# Patient Record
Sex: Male | Born: 1955 | Race: White | Hispanic: No | Marital: Married | Smoking: Former smoker
Health system: Southern US, Community
[De-identification: ages and names within clinical notes are randomized; demographics above are authoritative.]

## PROBLEM LIST (undated history)

## (undated) DIAGNOSIS — K219 Gastro-esophageal reflux disease without esophagitis: Secondary | ICD-10-CM

## (undated) DIAGNOSIS — E785 Hyperlipidemia, unspecified: Secondary | ICD-10-CM

## (undated) DIAGNOSIS — E559 Vitamin D deficiency, unspecified: Secondary | ICD-10-CM

## (undated) DIAGNOSIS — M6530 Trigger finger, unspecified finger: Secondary | ICD-10-CM

## (undated) DIAGNOSIS — N400 Enlarged prostate without lower urinary tract symptoms: Secondary | ICD-10-CM

## (undated) HISTORY — PX: EYE SURGERY: SHX253

## (undated) HISTORY — DX: Benign prostatic hyperplasia without lower urinary tract symptoms: N40.0

## (undated) HISTORY — PX: APPENDECTOMY: SHX54

## (undated) HISTORY — DX: Hyperlipidemia, unspecified: E78.5

## (undated) HISTORY — PX: OTHER SURGICAL HISTORY: SHX169

## (undated) HISTORY — DX: Vitamin D deficiency, unspecified: E55.9

---

## 1999-04-06 ENCOUNTER — Inpatient Hospital Stay (HOSPITAL_COMMUNITY): Admission: EM | Admit: 1999-04-06 | Discharge: 1999-04-08 | Payer: Self-pay | Admitting: Surgery

## 1999-04-06 ENCOUNTER — Encounter: Payer: Self-pay | Admitting: Surgery

## 1999-04-06 ENCOUNTER — Encounter (INDEPENDENT_AMBULATORY_CARE_PROVIDER_SITE_OTHER): Payer: Self-pay | Admitting: Specialist

## 2004-06-26 ENCOUNTER — Ambulatory Visit (HOSPITAL_COMMUNITY): Admission: RE | Admit: 2004-06-26 | Discharge: 2004-06-26 | Payer: Self-pay | Admitting: Family Medicine

## 2004-06-30 ENCOUNTER — Ambulatory Visit (HOSPITAL_COMMUNITY): Admission: RE | Admit: 2004-06-30 | Discharge: 2004-06-30 | Payer: Self-pay | Admitting: Family Medicine

## 2004-08-09 HISTORY — PX: CARPAL TUNNEL RELEASE: SHX101

## 2005-05-14 ENCOUNTER — Ambulatory Visit (HOSPITAL_BASED_OUTPATIENT_CLINIC_OR_DEPARTMENT_OTHER): Admission: RE | Admit: 2005-05-14 | Discharge: 2005-05-14 | Payer: Self-pay | Admitting: Orthopedic Surgery

## 2005-05-14 ENCOUNTER — Ambulatory Visit (HOSPITAL_COMMUNITY): Admission: RE | Admit: 2005-05-14 | Discharge: 2005-05-14 | Payer: Self-pay | Admitting: Orthopedic Surgery

## 2010-08-30 ENCOUNTER — Encounter: Payer: Self-pay | Admitting: Family Medicine

## 2010-12-25 NOTE — Op Note (Signed)
Michael Clark, Michael Clark               ACCOUNT NO.:  [id number]   MEDICAL RECORD NO.:  [id number]          PATIENT TYPE:  AMB   LOCATION:  [facility]                         FACILITY:  [facility]   PHYSICIAN:  Marlowe Kays, M.D.  DATE OF BIRTH:  [birth year]   DATE OF PROCEDURE:  05/14/2005  DATE OF DISCHARGE:                                 OPERATIVE REPORT   PREOPERATIVE DIAGNOSES:  1.  Symptomatic left trigger thumb.  2.  Left carpal tunnel syndrome.   POSTOPERATIVE DIAGNOSES:  1.  Symptomatic left trigger thumb.  2.  Left carpal tunnel syndrome.   OPERATION:  1.  Release A1 pulley, left thumb.  2.  Decompression median nerve left wrist and hand.   SURGEON:  Marlowe Kays, M.D.   ASSISTANT:  Nurse.   ANESTHESIA:  General.   PATHOLOGY AND JUSTIFICATION FOR PROCEDURE:  He has had a painful symptomatic  left trigger thumb which has failed to respond to cortisone injections as  well as severe carpal tunnel syndrome on the left documented by nerve  conduction studies. This had become very painful keeping him up at night and  consequently he is here today for the above-mentioned surgery.   DESCRIPTION OF PROCEDURE:  Satisfactory general anesthesia, pneumatic  tourniquet, the left upper extremity was esmarched out nonsterilely and  prepped from mid forearm to fingertips with DuraPrep, draped in a sterile  field. I first made a transverse incision at the MP crease of the thumb.  There were actually two of them and I took the more proximal one. The  neurovascular bundles were identified and protected to either side. The  flexor tendon was identified and the sheath opened with a 15 blade and I  released the tendon sheath proximally. His pulley was compressing the  tendons distally and I opened it for perhaps a centimeter resecting a  portion of the pulley and releasing it until impingement on the tendons was  completely relieved. This wound was irrigated with sterile saline and the  subcutaneous tissue closed with 4-0 Vicryl and the skin with interrupted 4-0  nylon mattress sutures. I then infiltrated the wound with half percent plain  Marcaine. I then opened the carpal tunnel having marked out an incision  along the base of thenar eminence crossing obliquely at the flexor crease of  wrist in the distal forearm. The palmaris longus tendon was identified and  retracted to the radial side, the median nerve was identified. I then began  releasing skin, subcutaneous tissue and a very thick fascia including some  muscle tissue in the mid palm. Potential bleeders were coagulated with  bipolar cautery. I released the nerve branches into the distal palm. I then  released the tourniquet and additional bleeders were coagulated with bipolar  cautery and the wound edges infiltrated with half percent plain Marcaine. I  then closed the wound with interrupted 4-0 nylon mattress sutures in the  skin and subcutaneous tissue only. Betadine and Adaptic were applied to both  incisions which I dressed separately to allow removal of the thumb dressing  individually from the remainder of the wrist portion.  A dry sterile dressing  was  applied to the thumb. A sterile Webril, volar plaster splint and Ace wrap  were applied to the wrist and hand. He tolerated the procedure well and at  the time of this dictation was on his way to the recovery room in  satisfactory condition with no known complications.           ______________________________  Marlowe Kays, M.D.     JA/MEDQ  D:  05/14/2005  T:  05/14/2005  Job:  [id number]

## 2011-07-13 ENCOUNTER — Encounter (HOSPITAL_BASED_OUTPATIENT_CLINIC_OR_DEPARTMENT_OTHER): Payer: Self-pay | Admitting: *Deleted

## 2011-07-14 ENCOUNTER — Encounter (HOSPITAL_BASED_OUTPATIENT_CLINIC_OR_DEPARTMENT_OTHER): Payer: Self-pay | Admitting: *Deleted

## 2011-07-14 NOTE — Progress Notes (Signed)
Pt instructed NPO p MN 12/6 x prilosec w sip of h20. Ok to use flonase.  To [facility] 12/7 @ 0600.  Needs hgb, ekg.  Requested ekg from [facility].

## 2011-07-16 ENCOUNTER — Ambulatory Visit (HOSPITAL_BASED_OUTPATIENT_CLINIC_OR_DEPARTMENT_OTHER)
Admission: RE | Admit: 2011-07-16 | Discharge: 2011-07-16 | Disposition: A | Discharge status: Home / Self Care | Payer: BC Managed Care – PPO | Source: Ambulatory Visit | Attending: Orthopedic Surgery | Admitting: Orthopedic Surgery

## 2011-07-16 ENCOUNTER — Encounter (HOSPITAL_BASED_OUTPATIENT_CLINIC_OR_DEPARTMENT_OTHER)
Admission: RE | Disposition: A | Discharge status: Home / Self Care | Payer: Self-pay | Source: Ambulatory Visit | Attending: Orthopedic Surgery

## 2011-07-16 ENCOUNTER — Encounter (HOSPITAL_BASED_OUTPATIENT_CLINIC_OR_DEPARTMENT_OTHER): Payer: Self-pay | Admitting: Anesthesiology

## 2011-07-16 ENCOUNTER — Encounter (HOSPITAL_BASED_OUTPATIENT_CLINIC_OR_DEPARTMENT_OTHER): Payer: Self-pay | Admitting: *Deleted

## 2011-07-16 ENCOUNTER — Other Ambulatory Visit: Payer: Self-pay | Admitting: Otolaryngology

## 2011-07-16 ENCOUNTER — Ambulatory Visit (HOSPITAL_BASED_OUTPATIENT_CLINIC_OR_DEPARTMENT_OTHER): Payer: BC Managed Care – PPO | Admitting: Anesthesiology

## 2011-07-16 ENCOUNTER — Other Ambulatory Visit: Payer: Self-pay

## 2011-07-16 DIAGNOSIS — Z79899 Other long term (current) drug therapy: Secondary | ICD-10-CM | POA: Insufficient documentation

## 2011-07-16 DIAGNOSIS — M6530 Trigger finger, unspecified finger: Secondary | ICD-10-CM | POA: Insufficient documentation

## 2011-07-16 DIAGNOSIS — K219 Gastro-esophageal reflux disease without esophagitis: Secondary | ICD-10-CM | POA: Insufficient documentation

## 2011-07-16 HISTORY — PX: TRIGGER FINGER RELEASE: SHX641

## 2011-07-16 HISTORY — DX: Trigger finger, unspecified finger: M65.30

## 2011-07-16 HISTORY — DX: Gastro-esophageal reflux disease without esophagitis: K21.9

## 2011-07-16 LAB — POCT HEMOGLOBIN-HEMACUE: Hemoglobin: 16.2 g/dL (ref 13.0–17.0)

## 2011-07-16 SURGERY — RELEASE, A1 PULLEY, FOR TRIGGER FINGER
Anesthesia: General | Site: Finger | Laterality: Left

## 2011-07-16 MED ORDER — KETOROLAC TROMETHAMINE 30 MG/ML IJ SOLN
INTRAMUSCULAR | Status: DC | PRN
  Administered 2011-07-16: 30 mg via INTRAVENOUS

## 2011-07-16 MED ORDER — OXYCODONE-ACETAMINOPHEN 5-325 MG PO TABS: 1.0000 | ORAL_TABLET | ORAL | Status: DC | PRN

## 2011-07-16 MED ORDER — PROMETHAZINE HCL 25 MG/ML IJ SOLN: 6.2500 mg | INTRAMUSCULAR | Status: DC | PRN

## 2011-07-16 MED ORDER — DEXAMETHASONE SODIUM PHOSPHATE 4 MG/ML IJ SOLN
INTRAMUSCULAR | Status: DC | PRN
  Administered 2011-07-16: 10 mg via INTRAVENOUS

## 2011-07-16 MED ORDER — LACTATED RINGERS IV SOLN
INTRAVENOUS | Status: DC
Start: 2011-07-16 — End: 2011-07-16
  Administered 2011-07-16: 07:00:00 via INTRAVENOUS

## 2011-07-16 MED ORDER — FENTANYL CITRATE 0.05 MG/ML IJ SOLN
INTRAMUSCULAR | Status: DC | PRN
  Administered 2011-07-16: 100 ug via INTRAVENOUS

## 2011-07-16 MED ORDER — LIDOCAINE HCL (CARDIAC) 20 MG/ML IV SOLN
INTRAVENOUS | Status: DC | PRN
  Administered 2011-07-16: 100 mg via INTRAVENOUS

## 2011-07-16 MED ORDER — MIDAZOLAM HCL 5 MG/5ML IJ SOLN
INTRAMUSCULAR | Status: DC | PRN
  Administered 2011-07-16: 2 mg via INTRAVENOUS

## 2011-07-16 MED ORDER — PROPOFOL 10 MG/ML IV EMUL
INTRAVENOUS | Status: DC | PRN
  Administered 2011-07-16: 300 mg via INTRAVENOUS

## 2011-07-16 MED ORDER — FENTANYL CITRATE 0.05 MG/ML IJ SOLN
25.0000 ug | INTRAMUSCULAR | Status: DC | PRN
  Administered 2011-07-16: 25 ug via INTRAVENOUS

## 2011-07-16 MED ORDER — ONDANSETRON HCL 4 MG/2ML IJ SOLN
INTRAMUSCULAR | Status: DC | PRN
  Administered 2011-07-16: 4 mg via INTRAVENOUS

## 2011-07-16 SURGICAL SUPPLY — 44 items
BANDAGE COBAN STERILE 2 (GAUZE/BANDAGES/DRESSINGS) ×2 IMPLANT
BANDAGE CONFORM 3  STR LF (GAUZE/BANDAGES/DRESSINGS) ×2 IMPLANT
BANDAGE ELASTIC 3 VELCRO ST LF (GAUZE/BANDAGES/DRESSINGS) ×2 IMPLANT
BANDAGE ELASTIC 4 VELCRO ST LF (GAUZE/BANDAGES/DRESSINGS) IMPLANT
BLADE SURG 15 STRL LF DISP TIS (BLADE) ×1 IMPLANT
BLADE SURG 15 STRL SS (BLADE) ×1
BNDG ESMARK 4X9 LF (GAUZE/BANDAGES/DRESSINGS) ×2 IMPLANT
CLOTH BEACON ORANGE TIMEOUT ST (SAFETY) ×2 IMPLANT
COVER TABLE BACK 60X90 (DRAPES) ×2 IMPLANT
DRAPE EXTREMITY T 121X128X90 (DRAPE) ×2 IMPLANT
DRAPE LG THREE QUARTER DISP (DRAPES) ×4 IMPLANT
DRAPE U-SHAPE 47X51 STRL (DRAPES) ×2 IMPLANT
DRSG EMULSION OIL 3X3 NADH (GAUZE/BANDAGES/DRESSINGS) ×2 IMPLANT
DURAPREP 26ML APPLICATOR (WOUND CARE) ×2 IMPLANT
ELECT REM PT RETURN 9FT ADLT (ELECTROSURGICAL) ×2
ELECTRODE REM PT RTRN 9FT ADLT (ELECTROSURGICAL) ×1 IMPLANT
GLOVE BIOGEL PI IND STRL 6.5 (GLOVE) ×1 IMPLANT
GLOVE BIOGEL PI INDICATOR 6.5 (GLOVE) ×1
GLOVE ECLIPSE 8.0 STRL XLNG CF (GLOVE) ×4 IMPLANT
GLOVE INDICATOR 8.0 STRL GRN (GLOVE) IMPLANT
GOWN PREVENTION PLUS LG XLONG (DISPOSABLE) ×2 IMPLANT
GOWN STRL REIN XL XLG (GOWN DISPOSABLE) ×2 IMPLANT
NS IRRIG 500ML POUR BTL (IV SOLUTION) ×2 IMPLANT
PACK BASIN DAY SURGERY FS (CUSTOM PROCEDURE TRAY) ×2 IMPLANT
PAD CAST 3X4 CTTN HI CHSV (CAST SUPPLIES) ×1 IMPLANT
PAD CAST 4YDX4 CTTN HI CHSV (CAST SUPPLIES) IMPLANT
PADDING CAST ABS 4INX4YD NS (CAST SUPPLIES) ×1
PADDING CAST ABS COTTON 4X4 ST (CAST SUPPLIES) ×1 IMPLANT
PADDING CAST COTTON 3X4 STRL (CAST SUPPLIES) ×1
PADDING CAST COTTON 4X4 STRL (CAST SUPPLIES)
PENCIL BUTTON HOLSTER BLD 10FT (ELECTRODE) IMPLANT
SPONGE GAUZE 4X4 12PLY (GAUZE/BANDAGES/DRESSINGS) ×2 IMPLANT
STOCKINETTE 4X48 STRL (DRAPES) ×2 IMPLANT
SUT ETHILON 4 0 PS 2 18 (SUTURE) ×6 IMPLANT
SUT VIC AB 3-0 SH 27 (SUTURE) ×2
SUT VIC AB 3-0 SH 27X BRD (SUTURE) ×2 IMPLANT
SUT VIC AB 4-0 P-3 18XBRD (SUTURE) IMPLANT
SUT VIC AB 4-0 P3 18 (SUTURE)
SUT VIC AB 4-0 SH 27 (SUTURE)
SUT VIC AB 4-0 SH 27XANBCTRL (SUTURE) IMPLANT
SYR BULB 3OZ (MISCELLANEOUS) ×2 IMPLANT
TOWEL OR 17X24 6PK STRL BLUE (TOWEL DISPOSABLE) ×4 IMPLANT
UNDERPAD 30X30 INCONTINENT (UNDERPADS AND DIAPERS) ×2 IMPLANT
WATER STERILE IRR 500ML POUR (IV SOLUTION) ×2 IMPLANT

## 2011-07-16 NOTE — Anesthesia Procedure Notes (Addendum)
Procedure Name: LMA Insertion Date/Time: 07/16/2011 7:46 AM Performed by: Huel Coventry Pre-anesthesia Checklist: Patient identified, Emergency Drugs available, Suction available and Patient being monitored Patient Re-evaluated:Patient Re-evaluated prior to inductionOxygen Delivery Method: Circle System Utilized Preoxygenation: Pre-oxygenation with 100% oxygen Intubation Type: IV induction Ventilation: Mask ventilation without difficulty LMA: LMA with gastric port inserted LMA Size: 5.0 Number of attempts: 1 Placement Confirmation: positive ETCO2 Tube secured with: Tape Dental Injury: Teeth and Oropharynx as per pre-operative assessment

## 2011-07-16 NOTE — Anesthesia Postprocedure Evaluation (Signed)
  Anesthesia Post-op Note  Patient: Michael Clark  Procedure(s) Performed:  RELEASE TRIGGER FINGER/A-1 PULLEY  Patient Location: PACU  Anesthesia Type: General  Level of Consciousness: awake and alert   Airway and Oxygen Therapy: Patient Spontanous Breathing  Post-op Pain: mild  Post-op Assessment: Post-op Vital signs reviewed, Patient's Cardiovascular Status Stable, Respiratory Function Stable, Patent Airway and No signs of Nausea or vomiting  Post-op Vital Signs: stable  Complications: No apparent anesthesia complications

## 2011-07-16 NOTE — Transfer of Care (Signed)
  Immediate Anesthesia Transfer of Care Note  Patient: Michael Clark  Procedure(s) Performed:  RELEASE TRIGGER FINGER/A-1 PULLEY  Patient Location: PACU  Anesthesia Type: General  Level of Consciousness: awake, alert  and oriented  Airway & Oxygen Therapy: Patient Spontanous Breathing and Patient connected to face mask oxygen  Post-op Assessment: Report given to PACU RN and Post -op Vital signs reviewed and stable  Post vital signs: Reviewed and stable  Complications: No apparent anesthesia complications

## 2011-07-16 NOTE — Op Note (Signed)
NAMEBLESSED, COTHAM NO.:  [id number]  MEDICAL RECORD NO.:  [id number]  LOCATION:                               FACILITY:  [facility]  PHYSICIAN:  Marlowe Kays, M.D.  DATE OF BIRTH:  [birth year]  DATE OF PROCEDURE:  07/16/2011 DATE OF DISCHARGE:                              OPERATIVE REPORT   PREOPERATIVE DIAGNOSIS:  Chronic left ring trigger finger unresponsive to nonsurgical treatment.  POSTOPERATIVE DIAGNOSIS:  Chronic left ring trigger finger unresponsive to nonsurgical treatment.  OPERATION:  Release A1 pulley, left ring finger.  SURGEON:  Marlowe Kays, MD  ASSISTANT:  Nurse.  ANESTHESIA:  General.  PATHOLOGY/INDICATION FOR PROCEDURE:  As stated in diagnosis.  PROCEDURE IN DETAIL:  Satisfactory general anesthesia (per the patient request), pneumatic tourniquet applied with the left upper extremity Esmarch'd out nonsterilely and tourniquet inflated to 250 mmHg.  The left upper extremity, from midforearm to fingertips, was then prepped with DuraPrep and draped in sterile field.  Time-out performed.  I made a transverse incision midway between the distal palmar crease and the MP crease of the ring finger.  With blunt dissection, went through soft tissues down through the underlying flexor tendons.  There was some residual steroid noted.  There were some adhesions around the tendon sheath.  Protecting the neurovascular structures, I opened the tendon sheath with a 15 knife blade and then released it as far proximally as there was any tightness around the flexor tendons.  I then released the portion of the A1 pulley distally, again as far as tightness was present, also resected the small portion of the pulley.  We then closed the wound with interrupted 3-0 Vicryl in subcutaneous tissue, 4-0 nylon interrupted mattress in the skin; Betadine, Adaptic, dry sterile dressing were applied.  He was given 30 mg of Toradol.  Tourniquet was released.  At the  time of this dictation, he was on his way to the recovery room in satisfactory condition with no known complications.          ______________________________ Marlowe Kays, M.D.     JA/MEDQ  D:  07/16/2011  T:  07/16/2011  Job:  [id number]

## 2011-07-16 NOTE — Brief Op Note (Signed)
07/16/2011  8:24 AM  PATIENT:  Michael Clark  55 y.o. male  PRE-OPERATIVE DIAGNOSIS:  left ring triger finger  POST-OPERATIVE DIAGNOSIS:  * No post-op diagnosis entered *  PROCEDURE:  Procedure(s): RELEASE TRIGGER FINGER/A-1 PULLEY Left ring  SURGEON:  Surgeon(s): James P Aplington  PHYSICIAN ASSISTANT: none  ASSISTANTS: none}   ANESTHESIA:   general  EBL:  Total I/O In: 100 [I.V.:100] Out: -   BLOOD ADMINISTERED:none  DRAINS: none   LOCAL MEDICATIONS USED:  NONE  SPECIMEN:  No Specimen  DISPOSITION OF SPECIMEN:  N/A  COUNTS:  YES  TOURNIQUET:   Total Tourniquet Time Documented: Upper Arm (Left) - 23 minutes  DICTATION: .Other Dictation: Dictation Number [id number]  PLAN OF CARE: Discharge to home after PACU  PATIENT DISPOSITION:  PACU - hemodynamically stable.   Delay start of Pharmacological VTE agent (>24hrs) due to surgical blood loss or risk of bleeding:not  APPLICABLE:[id number]

## 2011-07-16 NOTE — H&P (Signed)
Michael Clark is an 55 y.o. male.   Chief Complaintpainful triggering lt ring finger HPI:no relief with steroid injection  Past Medical History  Diagnosis Date  . GERD (gastroesophageal reflux disease)   . Trigger finger, left     Past Surgical History  Procedure Date  . Carpal tunnel release 2006    LEFT   . Left thum pulley release 2006--  DONE W/ CARPAL TUNNEL RELEASE  . Appendectomy     History reviewed. No pertinent family history. Social History:  reports that he has never smoked. He does not have any smokeless tobacco history on file. He reports that he drinks about 6 ounces of alcohol per week. He reports that he does not use illicit drugs.  Allergies: No Known Allergies  Medications Prior to Admission  Medication Dose Route Frequency Provider Last Rate Last Dose  . lactated ringers infusion   Intravenous Continuous Riesa Pope, MD 50 mL/hr at 07/16/11 4098     Medications Prior to Admission  Medication Sig Dispense Refill  . cholecalciferol (VITAMIN D) 1000 UNITS tablet Take 1,000 Units by mouth daily.        . fluticasone (FLONASE) 50 MCG/ACT nasal spray Place 2 sprays into the nose daily.        . multivitamin (THERAGRAN) per tablet Take 1 tablet by mouth daily.        . naproxen sodium (ANAPROX) 220 MG tablet Take 220 mg by mouth 2 (two) times daily with a meal.        . omega-3 acid ethyl esters (LOVAZA) 1 G capsule Take 2 g by mouth 2 (two) times daily.        Marland Kitchen omeprazole (PRILOSEC) 20 MG capsule Take 20 mg by mouth daily.          No results found for this or any previous visit (from the past 48 hour(s)). No results found.  ROS  Blood pressure 121/78, pulse 70, temperature 97.8 F (36.6 C), temperature source Oral, resp. rate 20, height 5\' 11"  (1.803 m), weight 97.523 kg (215 lb), SpO2 98.00%. Physical Exam  Constitutional: He is oriented to person, place, and time. He appears well-developed and well-nourished.  HENT:  Head: Normocephalic and  atraumatic.  Right Ear: External ear normal.  Left Ear: External ear normal.  Nose: Nose normal.  Mouth/Throat: Oropharynx is clear and moist.  Eyes: Conjunctivae and EOM are normal. Pupils are equal, round, and reactive to light.  Neck: Normal range of motion. Neck supple.  Cardiovascular: Normal rate, regular rhythm, normal heart sounds and intact distal pulses.   Respiratory: Effort normal and breath sounds normal.  GI: Soft. Bowel sounds are normal.  Musculoskeletal: Normal range of motion. He exhibits tenderness.       Tender A-1 pulley Lt ring finger  Neurological: He is alert and oriented to person, place, and time. He has normal reflexes.  Skin: Skin is warm and dry.  Psychiatric: He has a normal mood and affect. His behavior is normal. Judgment and thought content normal.     Assessment/Plan Painful lt ring trigger finger unresponsive to steroid injection Release lt ring trigger finger  Angelene Rome P 07/16/2011, 7:33 AM

## 2011-07-16 NOTE — Anesthesia Preprocedure Evaluation (Signed)
Anesthesia Evaluation  Patient identified by MRN, date of birth, ID band Patient awake    Reviewed: Allergy & Precautions, H&P , NPO status , Patient's Chart, lab work & pertinent test results  Airway Mallampati: II TM Distance: >3 FB Neck ROM: Full    Dental No notable dental hx. (+) Teeth Intact   Pulmonary neg pulmonary ROS,  clear to auscultation  Pulmonary exam normal       Cardiovascular neg cardio ROS Regular Normal    Neuro/Psych Negative Neurological ROS  Negative Psych ROS   GI/Hepatic negative GI ROS, Neg liver ROS, GERD-  Medicated and Controlled,  Endo/Other  Negative Endocrine ROS  Renal/GU negative Renal ROS  Genitourinary negative   Musculoskeletal negative musculoskeletal ROS (+)   Abdominal   Peds negative pediatric ROS (+)  Hematology negative hematology ROS (+)   Anesthesia Other Findings   Reproductive/Obstetrics negative OB ROS                           Anesthesia Physical Anesthesia Plan  ASA: I  Anesthesia Plan: General   Post-op Pain Management:    Induction: Intravenous  Airway Management Planned: LMA  Additional Equipment:   Intra-op Plan:   Post-operative Plan: Extubation in OR  Informed Consent: I have reviewed the patients History and Physical, chart, labs and discussed the procedure including the risks, benefits and alternatives for the proposed anesthesia with the patient or authorized representative who has indicated his/her understanding and acceptance.   Dental advisory given  Plan Discussed with: CRNA  Anesthesia Plan Comments:         Anesthesia Quick Evaluation

## 2011-07-19 ENCOUNTER — Encounter (HOSPITAL_BASED_OUTPATIENT_CLINIC_OR_DEPARTMENT_OTHER): Payer: Self-pay | Admitting: Orthopedic Surgery

## 2011-07-20 ENCOUNTER — Other Ambulatory Visit (HOSPITAL_COMMUNITY): Payer: Self-pay | Admitting: Otolaryngology

## 2011-07-20 DIAGNOSIS — E041 Nontoxic single thyroid nodule: Secondary | ICD-10-CM

## 2011-07-22 ENCOUNTER — Other Ambulatory Visit (HOSPITAL_COMMUNITY): Payer: BC Managed Care – PPO

## 2013-05-08 ENCOUNTER — Other Ambulatory Visit: Payer: Self-pay

## 2013-05-08 MED ORDER — OMEPRAZOLE 20 MG PO CPDR: 20.0000 mg | DELAYED_RELEASE_CAPSULE | Freq: Every day | ORAL | Status: DC

## 2013-05-08 NOTE — Telephone Encounter (Signed)
Last seen 09/14/12  DWM 

## 2013-07-20 ENCOUNTER — Other Ambulatory Visit: Payer: Self-pay

## 2013-07-20 MED ORDER — OMEPRAZOLE 20 MG PO CPDR: 20.0000 mg | DELAYED_RELEASE_CAPSULE | Freq: Every day | ORAL | Status: DC

## 2013-07-20 NOTE — Telephone Encounter (Signed)
Last seen 09/14/12  DWM

## 2013-08-30 ENCOUNTER — Telehealth: Payer: Self-pay | Admitting: Family Medicine

## 2013-08-30 MED ORDER — OMEPRAZOLE 20 MG PO CPDR: 20.0000 mg | DELAYED_RELEASE_CAPSULE | Freq: Every day | ORAL | Status: DC

## 2013-08-30 NOTE — Telephone Encounter (Signed)
Patient NTBS. Physical scheduled for March 2015. Reordered for 90 day supply with no refills. Can obtain additional refills at appt.

## 2013-09-04 ENCOUNTER — Telehealth: Payer: Self-pay | Admitting: Family Medicine

## 2013-09-05 NOTE — Telephone Encounter (Signed)
Per pt he has appt in march and enough med until appt.

## 2013-10-17 ENCOUNTER — Ambulatory Visit (INDEPENDENT_AMBULATORY_CARE_PROVIDER_SITE_OTHER): Payer: BC Managed Care – PPO | Admitting: Family Medicine

## 2013-10-17 ENCOUNTER — Ambulatory Visit (INDEPENDENT_AMBULATORY_CARE_PROVIDER_SITE_OTHER): Payer: BC Managed Care – PPO

## 2013-10-17 ENCOUNTER — Encounter: Payer: Self-pay | Admitting: Family Medicine

## 2013-10-17 VITALS — BP 129/82 | HR 82 | Temp 98.3°F | Ht 70.0 in

## 2013-10-17 DIAGNOSIS — E785 Hyperlipidemia, unspecified: Secondary | ICD-10-CM

## 2013-10-17 DIAGNOSIS — M79609 Pain in unspecified limb: Secondary | ICD-10-CM

## 2013-10-17 DIAGNOSIS — Z0000 Encounter for general adult medical examination without abnormal findings: Secondary | ICD-10-CM

## 2013-10-17 DIAGNOSIS — G609 Hereditary and idiopathic neuropathy, unspecified: Secondary | ICD-10-CM

## 2013-10-17 DIAGNOSIS — K219 Gastro-esophageal reflux disease without esophagitis: Secondary | ICD-10-CM | POA: Insufficient documentation

## 2013-10-17 DIAGNOSIS — Q762 Congenital spondylolisthesis: Secondary | ICD-10-CM

## 2013-10-17 DIAGNOSIS — N400 Enlarged prostate without lower urinary tract symptoms: Secondary | ICD-10-CM | POA: Insufficient documentation

## 2013-10-17 DIAGNOSIS — G629 Polyneuropathy, unspecified: Secondary | ICD-10-CM

## 2013-10-17 DIAGNOSIS — E559 Vitamin D deficiency, unspecified: Secondary | ICD-10-CM

## 2013-10-17 DIAGNOSIS — M654 Radial styloid tenosynovitis [de Quervain]: Secondary | ICD-10-CM

## 2013-10-17 DIAGNOSIS — M4310 Spondylolisthesis, site unspecified: Secondary | ICD-10-CM

## 2013-10-17 DIAGNOSIS — L570 Actinic keratosis: Secondary | ICD-10-CM

## 2013-10-17 DIAGNOSIS — J309 Allergic rhinitis, unspecified: Secondary | ICD-10-CM

## 2013-10-17 LAB — POCT UA - MICROSCOPIC ONLY
Bacteria, U Microscopic: NEGATIVE
Casts, Ur, LPF, POC: NEGATIVE
Crystals, Ur, HPF, POC: NEGATIVE
RBC, urine, microscopic: NEGATIVE
WBC, Ur, HPF, POC: NEGATIVE
Yeast, UA: NEGATIVE

## 2013-10-17 LAB — POCT URINALYSIS DIPSTICK
Bilirubin, UA: NEGATIVE
Blood, UA: NEGATIVE
Glucose, UA: NEGATIVE
Ketones, UA: NEGATIVE
Leukocytes, UA: NEGATIVE
Nitrite, UA: NEGATIVE
Spec Grav, UA: 1.02
Urobilinogen, UA: NEGATIVE
pH, UA: 6

## 2013-10-17 LAB — POCT CBC
Granulocyte percent: 52.1 %G (ref 37–80)
HCT, POC: 47.1 % (ref 43.5–53.7)
Hemoglobin: 15.5 g/dL (ref 14.1–18.1)
Lymph, poc: 2.7 (ref 0.6–3.4)
MCH, POC: 29.8 pg (ref 27–31.2)
MCHC: 33 g/dL (ref 31.8–35.4)
MCV: 90.4 fL (ref 80–97)
MPV: 9 fL (ref 0–99.8)
POC Granulocyte: 3.2 (ref 2–6.9)
POC LYMPH PERCENT: 43.5 %L (ref 10–50)
Platelet Count, POC: 216 10*3/uL (ref 142–424)
RBC: 5.2 M/uL (ref 4.69–6.13)
RDW, POC: 11.9 %
WBC: 6.1 10*3/uL (ref 4.6–10.2)

## 2013-10-17 MED ORDER — AZELASTINE HCL 0.1 % NA SOLN: NASAL | Status: DC

## 2013-10-17 NOTE — Progress Notes (Signed)
Subjective:    Patient ID: Michael Clark, male    DOB: 1955-08-27, 58 y.o.   MRN: [id number]  HPI Patient is here today for annual wellness exam and follow up of chronic medical problems. Patient is here today for his annual exam. He brings lab work to the visit done at work dated November 2014. He stopped his Crestor in December due to the fact that it was causing his legs to her. He has been working diligently with his diet and hopes that his cholesterol numbers will be better on the blood work we do today. He also had a PSA done with his blood work in November and this number was 1.0. He will be due to receive an FOBT today and he is going to check with his insurance regarding the Prevnar vaccine. He has had an eye exam done 2 months ago. The patient does complain of some left wrist pain and numbness in his left thigh and toes of the right foot. He also has several skin lesions he would like for [place] to evaluate. The blood work that he brought in today from his workplace will be scanned into the patient's record.        Patient Active Problem List   Diagnosis Date Noted  . GERD (gastroesophageal reflux disease) 10/17/2013  . BPH (benign prostatic hyperplasia) 10/17/2013  . Vitamin D deficiency 10/17/2013  . Hyperlipidemia 10/17/2013   Outpatient Encounter Prescriptions as of 10/17/2013  Medication Sig  . cholecalciferol (VITAMIN D) 1000 UNITS tablet Take 1,000 Units by mouth daily.    . fluticasone (FLONASE) 50 MCG/ACT nasal spray Place 2 sprays into the nose daily.    . naproxen sodium (ANAPROX) 220 MG tablet Take 220 mg by mouth 2 (two) times daily with a meal.    . omeprazole (PRILOSEC) 20 MG capsule Take 1 capsule (20 mg total) by mouth daily.  . [DISCONTINUED] multivitamin (THERAGRAN) per tablet Take 1 tablet by mouth daily.    . [DISCONTINUED] omega-3 acid ethyl esters (LOVAZA) 1 G capsule Take 2 g by mouth 2 (two) times daily.      Review of Systems  Constitutional:  Negative.   HENT: Negative.   Eyes: Negative.   Respiratory: Negative.   Cardiovascular: Negative.   Gastrointestinal: Negative.   Endocrine: Negative.   Genitourinary: Negative.   Musculoskeletal: Positive for arthralgias (left wrist pain). Myalgias: numbness in left thigh and right foot- toes.  Skin: Negative.        Several skin lesions  Allergic/Immunologic: Negative.   Neurological: Negative.   Hematological: Negative.   Psychiatric/Behavioral: Negative.        Objective:   Physical Exam  Nursing note and vitals reviewed. Constitutional: He is oriented to person, place, and time. He appears well-developed and well-nourished. No distress.  HENT:  Head: Normocephalic and atraumatic.  Right Ear: External ear normal.  Left Ear: External ear normal.  Mouth/Throat: Oropharynx is clear and moist. No oropharyngeal exudate.  Nasal congestion and erythema bilaterally  Eyes: Conjunctivae and EOM are normal. Pupils are equal, round, and reactive to light. Right eye exhibits no discharge. Left eye exhibits no discharge. No scleral icterus.  Neck: Normal range of motion. Neck supple. No thyromegaly present.  No carotid bruit  Cardiovascular: Normal rate, regular rhythm, normal heart sounds and intact distal pulses.  Exam reveals no gallop and no friction rub.   No murmur heard. At 72 per minute  Pulmonary/Chest: Effort normal and breath sounds normal. No respiratory distress.  He has no wheezes. He has no rales. He exhibits no tenderness.  No axillary adenopathy  Abdominal: Soft. Bowel sounds are normal. He exhibits no mass. There is no tenderness. There is no rebound and no guarding.  No inguinal adenopathy  Genitourinary: Rectum normal and penis normal.  The prostate gland was a large but soft and smooth without masses. There were no rectal masses. There was no inguinal hernia. The external genitalia were normal.  Musculoskeletal: Normal range of motion. He exhibits no edema and no  tenderness.  Hip motion bilaterally and hip abduction and leg raising without producing back pain  Lymphadenopathy:    He has no cervical adenopathy.  Neurological: He is alert and oriented to person, place, and time. He has normal reflexes.  Skin: Skin is warm and dry. No rash noted. No erythema. No pallor.  Psychiatric: He has a normal mood and affect. His behavior is normal. Judgment and thought content normal.   BP 129/82  Pulse 82  Temp(Src) 98.3 F (36.8 C) (Oral)  Ht _0  (1.778 m)  PF 230 L/min  EKG: Within normal limits [facility] reading (PRIMARY) by  Dr. France Ravens                                 Cryotherapy was done to the left lateral wrist 2 a lesion that appeared to be an actinic keratosis.      Assessment & Plan:  1. GERD (gastroesophageal reflux disease) - POCT CBC  2. BPH (benign prostatic hyperplasia) - POCT CBC - Testosterone,Free and Total  3. Vitamin D deficiency - Vit D  25 hydroxy (rtn osteoporosis monitoring)  4. Hyperlipidemia - Hepatic function panel - NMR, lipoprofile  5. Annual physical exam - POCT CBC - BMP8+EGFR - Hepatic function panel - POCT UA - Microscopic Only - POCT urinalysis dipstick - NMR, lipoprofile - Testosterone,Free and Total - Vit D  25 hydroxy (rtn osteoporosis monitoring) - Thyroid Panel With TSH - EKG 12-Lead  6. Allergic rhinitis - azelastine (ASTELIN) 137 MCG/SPRAY nasal spray; 1-2 sprays at bedtime ,Use in each nostril as directed  Dispense: 30 mL; Refill: 12  7. Peripheral neuropathy  8. AK left wrist -Cryotherapy without problems  Patient Instructions  Continue current medications. Continue good therapeutic lifestyle changes which include good diet and exercise. Fall precautions discussed with patient. If an FOBT was given today- please return it to our front desk. If you are over 76 years old - you may need Prevnar 24 or the adult Pneumonia vaccine.  Continue aggressive therapeutic lifestyle changes  with diet and exercise and plenty of water Check with your insurance regarding the Prevnar vaccine We will schedule you an appointment with the orthopedist about injecting your left wrist We will get LS spine films on you today to see if the problems with your foot and leg could be secondary to disc problems in your back Use the additional nose spray prescribed today along with the flonase Use his Cialis sample as directed   Arrie Senate MD

## 2013-10-17 NOTE — Patient Instructions (Addendum)
Continue current medications. Continue good therapeutic lifestyle changes which include good diet and exercise. Fall precautions discussed with patient. If an FOBT was given today- please return it to our front desk. If you are over 61 years old - you may need Prevnar 72 or the adult Pneumonia vaccine.  Continue aggressive therapeutic lifestyle changes with diet and exercise and plenty of water Check with your insurance regarding the Prevnar vaccine We will schedule you an appointment with the orthopedist about injecting your left wrist We will get LS spine films on you today to see if the problems with your foot and leg could be secondary to disc problems in your back Use the additional nose spray prescribed today along with the flonase Use his Cialis sample as directed

## 2013-10-17 NOTE — Addendum Note (Signed)
Addended by: Zannie Cove on: 10/17/2013 12:19 PM   Modules accepted: Orders

## 2013-10-19 LAB — NMR, LIPOPROFILE
Cholesterol: 217 mg/dL — ABNORMAL HIGH (ref <200)
HDL Cholesterol by NMR: 41 mg/dL (ref >=40)
HDL Particle Number: 27 umol/L — ABNORMAL LOW (ref >=30.5)
LDL Particle Number: 2365 nmol/L — ABNORMAL HIGH (ref <1000)
LDL Size: 19.6 nm — ABNORMAL LOW (ref >20.5)
LDLC SERPL CALC-MCNC: 105 mg/dL — ABNORMAL HIGH (ref <100)
LP-IR Score: 80 — ABNORMAL HIGH (ref <=45)
Small LDL Particle Number: 1739 nmol/L — ABNORMAL HIGH (ref <=527)
Triglycerides by NMR: 355 mg/dL — ABNORMAL HIGH (ref <150)

## 2013-10-19 LAB — HEPATIC FUNCTION PANEL
ALT: 16 IU/L (ref 0–44)
AST: 19 IU/L (ref 0–40)
Albumin: 4.5 g/dL (ref 3.5–5.5)
Alkaline Phosphatase: 72 IU/L (ref 39–117)
Bilirubin, Direct: 0.16 mg/dL (ref 0.00–0.40)
Total Bilirubin: 0.8 mg/dL (ref 0.0–1.2)
Total Protein: 6.7 g/dL (ref 6.0–8.5)

## 2013-10-19 LAB — BMP8+EGFR
BUN/Creatinine Ratio: 17 (ref 9–20)
BUN: 16 mg/dL (ref 6–24)
CO2: 21 mmol/L (ref 18–29)
Calcium: 9.7 mg/dL (ref 8.7–10.2)
Chloride: 101 mmol/L (ref 97–108)
Creatinine, Ser: 0.95 mg/dL (ref 0.76–1.27)
GFR calc Af Amer: 101 mL/min/{1.73_m2} (ref >59)
GFR calc non Af Amer: 87 mL/min/{1.73_m2} (ref >59)
Glucose: 102 mg/dL — ABNORMAL HIGH (ref 65–99)
Potassium: 4.3 mmol/L (ref 3.5–5.2)
Sodium: 141 mmol/L (ref 134–144)

## 2013-10-19 LAB — VITAMIN D 25 HYDROXY (VIT D DEFICIENCY, FRACTURES): Vit D, 25-Hydroxy: 21.3 ng/mL — ABNORMAL LOW (ref 30.0–100.0)

## 2013-10-19 LAB — PSA, TOTAL AND FREE
PSA, Free Pct: 28.6 %
PSA, Free: 0.4 ng/mL
PSA: 1.4 ng/mL (ref 0.0–4.0)

## 2013-10-19 LAB — THYROID PANEL WITH TSH
Free Thyroxine Index: 2.6 (ref 1.2–4.9)
T3 Uptake Ratio: 33 % (ref 24–39)
T4, Total: 7.8 ug/dL (ref 4.5–12.0)
TSH: 3.07 u[IU]/mL (ref 0.450–4.500)

## 2013-10-19 LAB — TESTOSTERONE,FREE AND TOTAL
Testosterone, Free: 8.2 pg/mL (ref 7.2–24.0)
Testosterone: 341 ng/dL — ABNORMAL LOW (ref 348–1197)

## 2013-10-22 DIAGNOSIS — Q762 Congenital spondylolisthesis: Secondary | ICD-10-CM | POA: Insufficient documentation

## 2013-10-23 ENCOUNTER — Other Ambulatory Visit: Payer: Self-pay | Admitting: *Deleted

## 2013-10-23 DIAGNOSIS — E785 Hyperlipidemia, unspecified: Secondary | ICD-10-CM

## 2013-10-23 MED ORDER — VITAMIN D (ERGOCALCIFEROL) 1.25 MG (50000 UNIT) PO CAPS: 50000.0000 [IU] | ORAL_CAPSULE | ORAL | Status: DC

## 2013-10-23 MED ORDER — PITAVASTATIN CALCIUM 2 MG PO TABS: 2.0000 mg | ORAL_TABLET | Freq: Every day | ORAL | Status: DC

## 2013-11-04 ENCOUNTER — Other Ambulatory Visit: Payer: Self-pay | Admitting: Family Medicine

## 2014-02-25 ENCOUNTER — Other Ambulatory Visit: Payer: Self-pay | Admitting: Family Medicine

## 2014-04-02 ENCOUNTER — Telehealth: Payer: Self-pay | Admitting: Family Medicine

## 2014-04-02 DIAGNOSIS — Z87790 Personal history of (corrected) congenital malformations of face and neck: Secondary | ICD-10-CM

## 2014-04-02 NOTE — Telephone Encounter (Signed)
Please do a referral to Dr. Constance Holster in [place] for this problem

## 2014-04-03 NOTE — Telephone Encounter (Signed)
Pt aware referral sent to Dr. Constance Holster

## 2014-04-09 ENCOUNTER — Other Ambulatory Visit: Payer: Self-pay | Admitting: Otolaryngology

## 2014-04-09 ENCOUNTER — Other Ambulatory Visit (HOSPITAL_COMMUNITY)
Admission: RE | Admit: 2014-04-09 | Discharge: 2014-04-09 | Disposition: A | Discharge status: Home / Self Care | Payer: BC Managed Care – PPO | Source: Ambulatory Visit | Attending: Otolaryngology | Admitting: Otolaryngology

## 2014-04-09 DIAGNOSIS — R221 Localized swelling, mass and lump, neck: Principal | ICD-10-CM

## 2014-04-09 DIAGNOSIS — R220 Localized swelling, mass and lump, head: Secondary | ICD-10-CM | POA: Insufficient documentation

## 2014-04-28 ENCOUNTER — Other Ambulatory Visit: Payer: Self-pay | Admitting: Family Medicine

## 2014-04-29 NOTE — Telephone Encounter (Signed)
Last ov 3/15

## 2014-06-07 ENCOUNTER — Other Ambulatory Visit: Payer: Self-pay | Admitting: Otolaryngology

## 2014-08-05 ENCOUNTER — Other Ambulatory Visit: Payer: Self-pay | Admitting: *Deleted

## 2014-08-05 MED ORDER — OMEPRAZOLE 20 MG PO CPDR: DELAYED_RELEASE_CAPSULE | ORAL | Status: DC

## 2014-10-04 ENCOUNTER — Other Ambulatory Visit: Payer: Self-pay | Admitting: Family Medicine

## 2014-10-21 ENCOUNTER — Other Ambulatory Visit (INDEPENDENT_AMBULATORY_CARE_PROVIDER_SITE_OTHER): Payer: BLUE CROSS/BLUE SHIELD

## 2014-10-21 DIAGNOSIS — K219 Gastro-esophageal reflux disease without esophagitis: Secondary | ICD-10-CM

## 2014-10-21 DIAGNOSIS — E559 Vitamin D deficiency, unspecified: Secondary | ICD-10-CM

## 2014-10-21 DIAGNOSIS — N400 Enlarged prostate without lower urinary tract symptoms: Secondary | ICD-10-CM | POA: Diagnosis not present

## 2014-10-21 DIAGNOSIS — E785 Hyperlipidemia, unspecified: Secondary | ICD-10-CM

## 2014-10-21 DIAGNOSIS — Z0000 Encounter for general adult medical examination without abnormal findings: Secondary | ICD-10-CM | POA: Diagnosis not present

## 2014-10-21 LAB — POCT CBC
Granulocyte percent: 56.9 %G (ref 37–80)
HCT, POC: 46.3 % (ref 43.5–53.7)
Hemoglobin: 15 g/dL (ref 14.1–18.1)
Lymph, poc: 2.1 (ref 0.6–3.4)
MCH, POC: 28.8 pg (ref 27–31.2)
MCHC: 32.3 g/dL (ref 31.8–35.4)
MCV: 89 fL (ref 80–97)
MPV: 8.7 fL (ref 0–99.8)
POC Granulocyte: 3.1 (ref 2–6.9)
POC LYMPH PERCENT: 38.1 %L (ref 10–50)
Platelet Count, POC: 203 10*3/uL (ref 142–424)
RBC: 5.2 M/uL (ref 4.69–6.13)
RDW, POC: 12.5 %
WBC: 5.4 10*3/uL (ref 4.6–10.2)

## 2014-10-21 NOTE — Progress Notes (Signed)
Lab only 

## 2014-10-22 LAB — HEPATIC FUNCTION PANEL
ALT: 25 IU/L (ref 0–44)
AST: 22 IU/L (ref 0–40)
Albumin: 4.6 g/dL (ref 3.6–4.8)
Alkaline Phosphatase: 66 IU/L (ref 39–117)
Bilirubin Total: 1 mg/dL (ref 0.0–1.2)
Bilirubin, Direct: 0.23 mg/dL (ref 0.00–0.40)
Total Protein: 6.7 g/dL (ref 6.0–8.5)

## 2014-10-22 LAB — PSA, TOTAL AND FREE
PSA, Free Pct: 30 %
PSA, Free: 0.36 ng/mL
PSA: 1.2 ng/mL (ref 0.0–4.0)

## 2014-10-22 LAB — BMP8+EGFR
BUN/Creatinine Ratio: 14 (ref 10–22)
BUN: 13 mg/dL (ref 8–27)
CO2: 22 mmol/L (ref 18–29)
Calcium: 9.4 mg/dL (ref 8.6–10.2)
Chloride: 104 mmol/L (ref 97–108)
Creatinine, Ser: 0.95 mg/dL (ref 0.76–1.27)
GFR calc Af Amer: 100 mL/min/{1.73_m2} (ref >59)
GFR calc non Af Amer: 87 mL/min/{1.73_m2} (ref >59)
Glucose: 105 mg/dL — ABNORMAL HIGH (ref 65–99)
Potassium: 4.3 mmol/L (ref 3.5–5.2)
Sodium: 142 mmol/L (ref 134–144)

## 2014-10-22 LAB — VITAMIN D 25 HYDROXY (VIT D DEFICIENCY, FRACTURES): Vit D, 25-Hydroxy: 22.4 ng/mL — ABNORMAL LOW (ref 30.0–100.0)

## 2014-10-22 LAB — NMR, LIPOPROFILE
Cholesterol: 148 mg/dL (ref 100–199)
HDL Cholesterol by NMR: 44 mg/dL (ref >39)
HDL Particle Number: 35.2 umol/L (ref >=30.5)
LDL Particle Number: 993 nmol/L (ref <1000)
LDL Size: 19.9 nm (ref >20.5)
LDL-C: 61 mg/dL (ref 0–99)
LP-IR Score: 72 — ABNORMAL HIGH (ref <=45)
Small LDL Particle Number: 746 nmol/L — ABNORMAL HIGH (ref <=527)
Triglycerides by NMR: 213 mg/dL — ABNORMAL HIGH (ref 0–149)

## 2014-10-23 ENCOUNTER — Encounter: Payer: Self-pay | Admitting: Family Medicine

## 2014-10-23 ENCOUNTER — Ambulatory Visit (INDEPENDENT_AMBULATORY_CARE_PROVIDER_SITE_OTHER): Payer: BLUE CROSS/BLUE SHIELD | Admitting: Family Medicine

## 2014-10-23 ENCOUNTER — Ambulatory Visit (INDEPENDENT_AMBULATORY_CARE_PROVIDER_SITE_OTHER): Payer: BLUE CROSS/BLUE SHIELD

## 2014-10-23 VITALS — BP 135/81 | HR 73 | Temp 97.6°F | Ht 70.0 in | Wt 233.0 lb

## 2014-10-23 DIAGNOSIS — Z0000 Encounter for general adult medical examination without abnormal findings: Secondary | ICD-10-CM

## 2014-10-23 DIAGNOSIS — M25572 Pain in left ankle and joints of left foot: Secondary | ICD-10-CM

## 2014-10-23 DIAGNOSIS — N400 Enlarged prostate without lower urinary tract symptoms: Secondary | ICD-10-CM

## 2014-10-23 DIAGNOSIS — L570 Actinic keratosis: Secondary | ICD-10-CM | POA: Diagnosis not present

## 2014-10-23 DIAGNOSIS — J9801 Acute bronchospasm: Secondary | ICD-10-CM

## 2014-10-23 DIAGNOSIS — E785 Hyperlipidemia, unspecified: Secondary | ICD-10-CM

## 2014-10-23 DIAGNOSIS — R638 Other symptoms and signs concerning food and fluid intake: Secondary | ICD-10-CM

## 2014-10-23 DIAGNOSIS — E559 Vitamin D deficiency, unspecified: Secondary | ICD-10-CM

## 2014-10-23 DIAGNOSIS — J309 Allergic rhinitis, unspecified: Secondary | ICD-10-CM

## 2014-10-23 DIAGNOSIS — K219 Gastro-esophageal reflux disease without esophagitis: Secondary | ICD-10-CM

## 2014-10-23 LAB — POCT URINALYSIS DIPSTICK
Bilirubin, UA: NEGATIVE
Glucose, UA: NEGATIVE
Ketones, UA: NEGATIVE
Leukocytes, UA: NEGATIVE
Nitrite, UA: NEGATIVE
Spec Grav, UA: 1.01
Urobilinogen, UA: NEGATIVE
pH, UA: 6.5

## 2014-10-23 LAB — POCT UA - MICROSCOPIC ONLY
Casts, Ur, LPF, POC: NEGATIVE
Crystals, Ur, HPF, POC: NEGATIVE
RBC, urine, microscopic: NEGATIVE
WBC, Ur, HPF, POC: NEGATIVE
Yeast, UA: NEGATIVE

## 2014-10-23 MED ORDER — FLUTICASONE FUROATE-VILANTEROL 100-25 MCG/INH IN AEPB: 1.0000 | INHALATION_SPRAY | Freq: Every day | RESPIRATORY_TRACT | Status: DC

## 2014-10-23 MED ORDER — CRESTOR 20 MG PO TABS: 20.0000 mg | ORAL_TABLET | Freq: Every day | ORAL | Status: DC

## 2014-10-23 MED ORDER — AZELASTINE HCL 0.1 % NA SOLN: NASAL | Status: DC

## 2014-10-23 NOTE — Progress Notes (Signed)
Subjective:    Patient ID: Michael Clark, male    DOB: 06/13/1956, 59 y.o.   MRN: [id number]  HPI Patient is here today for annual wellness exam and follow up of chronic medical problems which includes hyperlipidemia. He is taking medications regularly. On review of systems today the patient is complaining of some chest tightness and shortness of breath with increased congestion. He is also complaining of some left ankle pain and increased belching. He is requesting a refill on his Astelin. He is due for an FOBT and has had lab work which we will go over with him during the visit today. He will check with his insurance regarding the Prevnar vaccine. His next colonoscopy is due next year.       Patient Active Problem List   Diagnosis Date Noted  . GERD (gastroesophageal reflux disease) 10/17/2013  . BPH (benign prostatic hyperplasia) 10/17/2013  . Vitamin D deficiency 10/17/2013  . Hyperlipidemia 10/17/2013   Outpatient Encounter Prescriptions as of 10/23/2014  Medication Sig  . azelastine (ASTELIN) 137 MCG/SPRAY nasal spray 1-2 sprays at bedtime ,Use in each nostril as directed  . CRESTOR 20 MG tablet Take 1 tablet by mouth daily.  . fluticasone (FLONASE) 50 MCG/ACT nasal spray Place 2 sprays into the nose daily.    . naproxen sodium (ANAPROX) 220 MG tablet Take 220 mg by mouth 2 (two) times daily with a meal.    . omeprazole (PRILOSEC) 20 MG capsule TAKE ONE (1) CAPSULE EACH DAY  . [DISCONTINUED] cholecalciferol (VITAMIN D) 1000 UNITS tablet Take 1,000 Units by mouth daily.    . [DISCONTINUED] Pitavastatin Calcium 2 MG TABS Take 1 tablet (2 mg total) by mouth daily.  . [DISCONTINUED] Vitamin D, Ergocalciferol, (DRISDOL) 50000 UNITS CAPS capsule Take 1 capsule (50,000 Units total) by mouth every 7 (seven) days.      Review of Systems  Constitutional: Negative.   HENT: Negative.   Eyes: Negative.   Respiratory: Positive for chest tightness, shortness of breath and wheezing.     Cardiovascular: Negative.   Gastrointestinal: Negative.        Belching  Endocrine: Negative.   Genitourinary: Negative.   Musculoskeletal: Positive for arthralgias (left ankle pain).  Skin: Negative.        Skin lesion on top of head  Allergic/Immunologic: Negative.   Neurological: Negative.   Hematological: Negative.   Psychiatric/Behavioral: Negative.        Objective:   Physical Exam  Constitutional: He is oriented to person, place, and time. He appears well-developed and well-nourished. No distress.  Overweight, pleasant and alert  HENT:  Head: Normocephalic and atraumatic.  Right Ear: External ear normal.  Left Ear: External ear normal.  Nose: Nose normal.  Mouth/Throat: Oropharynx is clear and moist. No oropharyngeal exudate.  Eyes: Conjunctivae and EOM are normal. Pupils are equal, round, and reactive to light. Right eye exhibits no discharge. Left eye exhibits no discharge. No scleral icterus.  Neck: Normal range of motion. Neck supple. No thyromegaly present.  No anterior cervical adenopathy or carotid bruits  Cardiovascular: Normal rate, regular rhythm, normal heart sounds and intact distal pulses.  Exam reveals no gallop and no friction rub.   No murmur heard. At 72/m  Pulmonary/Chest: Effort normal and breath sounds normal. No respiratory distress. He has no wheezes. He has no rales. He exhibits no tenderness.  Were no wheezes on auscultation today and with coughing there was minimal congestion  Abdominal: Soft. Bowel sounds are normal. He exhibits  no mass. There is no tenderness. There is no rebound and no guarding.  Obese without tenderness  Genitourinary: Rectum normal and penis normal.  The prostate was enlarged but smooth and there are no rectal masses. The external genitalia were within normal limits without inguinal hernia present and there were no inguinal nodes.  Musculoskeletal: Normal range of motion. He exhibits no edema or tenderness.  There was  slight tenderness anterior to the left lateral malleolus  Lymphadenopathy:    He has no cervical adenopathy.  Neurological: He is alert and oriented to person, place, and time. He has normal reflexes. No cranial nerve deficit.  Skin: Skin is warm and dry. No rash noted. No erythema. No pallor.  There are a couple areas of actinic keratosis on the scalp and cryotherapy was performed on these before the patient left the office without any problems  Psychiatric: He has a normal mood and affect. His behavior is normal. Judgment and thought content normal.  Nursing note and vitals reviewed.  BP 135/81 mmHg  Pulse 73  Temp(Src) 97.6 F (36.4 C) (Oral)  Ht 5\' 10"  (1.778 m)  Wt 233 lb (105.688 kg)  BMI 33.43 kg/m2  [facility] reading (PRIMARY) by  Dr. Brunilda Payor x-ray-within normal limits and no active disease                                        Assessment & Plan:  1. Gastroesophageal reflux disease, esophagitis presence not specified -This is been stable. The patient does have occasional belching but he also needs to lose weight to help with this.  2. Vitamin D deficiency -The vitamin D level is low and he is been started on vitamin D 50,000 units weekly and we'll have his vitamin D level repeated along with a lipid panel in 3-4 months  3. BPH (benign prostatic hyperplasia) -He is not having any problems with voiding and his PSA is normal. - POCT UA - Microscopic Only - POCT urinalysis dipstick  4. Hyperlipidemia -Cholesterol numbers are much improved on the Crestor by taking it regularly and he is given a new prescription for this with a coupon today to help lower the cost - DG Chest 2 View; Future  5. Annual physical exam -The patient needs to check with his insurance regarding the poor vaccine - POCT UA - Microscopic Only - POCT urinalysis dipstick - DG Chest 2 View; Future  6. Allergic rhinitis, unspecified allergic rhinitis type -He should continue with the nose spray and  nasal saline if needed - azelastine (ASTELIN) 0.1 % nasal spray; 1-2 sprays at bedtime ,Use in each nostril as directed  Dispense: 30 mL; Refill: 11  7. Bronchospasm -He should use the Breo Ellipta-1 puff daily for 3-4 weeks and get back in touch with [place] if the breathing and wheezing problems continue. He should also use Mucinex to loosen congestion and help cough  8. Increased BMI -The patient was offered an opportunity to visit with the clinical pharmacist to discuss weight loss and he will try to do this on his own and will also try to decrease his intake of alcohol.  9. AK (actinic keratosis) -Cryotherapy on 2 scalp lesions without problems  10. Left ankle pain -Take ibuprofen twice daily for the next 1-2 weeks after breakfast and supper  Meds ordered this encounter  Medications  . DISCONTD: CRESTOR 20 MG tablet    Sig:  Take 1 tablet by mouth daily.  Marland Kitchen azelastine (ASTELIN) 0.1 % nasal spray    Sig: 1-2 sprays at bedtime ,Use in each nostril as directed    Dispense:  30 mL    Refill:  11  . CRESTOR 20 MG tablet    Sig: Take 1 tablet (20 mg total) by mouth daily.    Dispense:  90 tablet    Refill:  3  . Fluticasone Furoate-Vilanterol (BREO ELLIPTA) 100-25 MCG/INH AEPB    Sig: Inhale 1 Inhaler into the lungs daily.    Dispense:  28 each    Refill:  11   Patient Instructions  Continue current medications. Continue good therapeutic lifestyle changes which include good diet and exercise. Fall precautions discussed with patient. If an FOBT was given today- please return it to our front desk. If you are over 53 years old - you may need Prevnar 81 or the adult Pneumonia vaccine.  Flu Shots are still available at our office. If you still haven't had one please call to set up a nurse visit to get one.   After your visit with [place] today you will receive a survey in the mail or online from [employer] regarding your care with [place]. Please take a moment to fill this out. Your feedback is  very important to [place] as you can help [place] better understand your patient needs as well as improve your experience and satisfaction. WE CARE ABOUT YOU!!!   Please make more efforts to work on the weight. Less. Her would be helpful more walking and exercise would be helpful Continue taking the Crestor and try to drink more water You have some reactive airway disease and we will give you an inhaler to use at home on a regular basis for a couple weeks to see if this will help you get better.--Please get back with [place] if the wheezing continued We will also encourage you to take Mucinex maximum strength 1 twice daily with a large glass of water Please take the vitamin D 50,000 units weekly for 3 months and return to clinic in 3-4 months and recheck the vitamin D level and a traditional lipid liver panel to see if we can get the triglycerides down more Take ibuprofen 200 mg twice daily after breakfast and supper for a couple of weeks to see if this will help the ankle pain Continue taking your omeprazole regularly We will arrange for you to have a stress test this fall in the office He should check with your insurance regarding the Prevnar vaccine Please check with your insurance regarding the Prevnar vaccine   Arrie Senate MD

## 2014-10-23 NOTE — Patient Instructions (Addendum)
Continue current medications. Continue good therapeutic lifestyle changes which include good diet and exercise. Fall precautions discussed with patient. If an FOBT was given today- please return it to our front desk. If you are over 61 years old - you may need Prevnar 72 or the adult Pneumonia vaccine.  Flu Shots are still available at our office. If you still haven't had one please call to set up a nurse visit to get one.   After your visit with [place] today you will receive a survey in the mail or online from [employer] regarding your care with [place]. Please take a moment to fill this out. Your feedback is very important to [place] as you can help [place] better understand your patient needs as well as improve your experience and satisfaction. WE CARE ABOUT YOU!!!   Please make more efforts to work on the weight. Less. Her would be helpful more walking and exercise would be helpful Continue taking the Crestor and try to drink more water You have some reactive airway disease and we will give you an inhaler to use at home on a regular basis for a couple weeks to see if this will help you get better.--Please get back with [place] if the wheezing continued We will also encourage you to take Mucinex maximum strength 1 twice daily with a large glass of water Please take the vitamin D 50,000 units weekly for 3 months and return to clinic in 3-4 months and recheck the vitamin D level and a traditional lipid liver panel to see if we can get the triglycerides down more Take ibuprofen 200 mg twice daily after breakfast and supper for a couple of weeks to see if this will help the ankle pain Continue taking your omeprazole regularly We will arrange for you to have a stress test this fall in the office He should check with your insurance regarding the Prevnar vaccine Please check with your insurance regarding the Prevnar vaccine

## 2014-12-09 ENCOUNTER — Other Ambulatory Visit: Payer: Self-pay | Admitting: Family Medicine

## 2015-04-20 ENCOUNTER — Other Ambulatory Visit: Payer: Self-pay | Admitting: Family Medicine

## 2015-05-01 ENCOUNTER — Ambulatory Visit: Payer: BLUE CROSS/BLUE SHIELD | Admitting: Family Medicine

## 2015-05-27 ENCOUNTER — Other Ambulatory Visit (INDEPENDENT_AMBULATORY_CARE_PROVIDER_SITE_OTHER): Payer: BLUE CROSS/BLUE SHIELD | Admitting: *Deleted

## 2015-05-27 DIAGNOSIS — E559 Vitamin D deficiency, unspecified: Secondary | ICD-10-CM

## 2015-05-27 DIAGNOSIS — E785 Hyperlipidemia, unspecified: Secondary | ICD-10-CM

## 2015-05-27 DIAGNOSIS — K219 Gastro-esophageal reflux disease without esophagitis: Secondary | ICD-10-CM

## 2015-05-27 DIAGNOSIS — Z23 Encounter for immunization: Secondary | ICD-10-CM | POA: Diagnosis not present

## 2015-05-27 DIAGNOSIS — N400 Enlarged prostate without lower urinary tract symptoms: Secondary | ICD-10-CM

## 2015-05-27 NOTE — Addendum Note (Signed)
Addended by: Zannie Cove on: 05/27/2015 05:20 PM   Modules accepted: Orders

## 2015-05-28 LAB — BMP8+EGFR
BUN/Creatinine Ratio: 13 (ref 10–22)
BUN: 12 mg/dL (ref 8–27)
CO2: 23 mmol/L (ref 18–29)
Calcium: 9.5 mg/dL (ref 8.6–10.2)
Chloride: 101 mmol/L (ref 97–106)
Creatinine, Ser: 0.95 mg/dL (ref 0.76–1.27)
GFR calc Af Amer: 99 mL/min/{1.73_m2} (ref >59)
GFR calc non Af Amer: 86 mL/min/{1.73_m2} (ref >59)
Glucose: 104 mg/dL — ABNORMAL HIGH (ref 65–99)
Potassium: 4.4 mmol/L (ref 3.5–5.2)
Sodium: 139 mmol/L (ref 136–144)

## 2015-05-28 LAB — CBC WITH DIFFERENTIAL/PLATELET
Basophils Absolute: 0 10*3/uL (ref 0.0–0.2)
Basos: 0 %
EOS (ABSOLUTE): 0.1 10*3/uL (ref 0.0–0.4)
Eos: 1 %
Hematocrit: 45.3 % (ref 37.5–51.0)
Hemoglobin: 15.7 g/dL (ref 12.6–17.7)
Immature Grans (Abs): 0 10*3/uL (ref 0.0–0.1)
Immature Granulocytes: 0 %
Lymphocytes Absolute: 2.5 10*3/uL (ref 0.7–3.1)
Lymphs: 37 %
MCH: 30.5 pg (ref 26.6–33.0)
MCHC: 34.7 g/dL (ref 31.5–35.7)
MCV: 88 fL (ref 79–97)
Monocytes Absolute: 0.6 10*3/uL (ref 0.1–0.9)
Monocytes: 9 %
Neutrophils Absolute: 3.6 10*3/uL (ref 1.4–7.0)
Neutrophils: 53 %
Platelets: 236 10*3/uL (ref 150–379)
RBC: 5.15 x10E6/uL (ref 4.14–5.80)
RDW: 13.2 % (ref 12.3–15.4)
WBC: 6.9 10*3/uL (ref 3.4–10.8)

## 2015-05-28 LAB — NMR, LIPOPROFILE
Cholesterol: 184 mg/dL (ref 100–199)
HDL Cholesterol by NMR: 54 mg/dL (ref >39)
HDL Particle Number: 33.8 umol/L (ref >=30.5)
LDL Particle Number: 1361 nmol/L — ABNORMAL HIGH (ref <1000)
LDL Size: 21.2 nm (ref >20.5)
LDL-C: 117 mg/dL — ABNORMAL HIGH (ref 0–99)
LP-IR Score: <25 (ref <=45)
Small LDL Particle Number: 454 nmol/L (ref <=527)
Triglycerides by NMR: 66 mg/dL (ref 0–149)

## 2015-05-28 LAB — HEPATIC FUNCTION PANEL
ALT: 26 IU/L (ref 0–44)
AST: 22 IU/L (ref 0–40)
Albumin: 4.6 g/dL (ref 3.6–4.8)
Alkaline Phosphatase: 70 IU/L (ref 39–117)
Bilirubin Total: 0.7 mg/dL (ref 0.0–1.2)
Bilirubin, Direct: 0.2 mg/dL (ref 0.00–0.40)
Total Protein: 6.9 g/dL (ref 6.0–8.5)

## 2015-05-28 LAB — VITAMIN D 25 HYDROXY (VIT D DEFICIENCY, FRACTURES): Vit D, 25-Hydroxy: 25.9 ng/mL — ABNORMAL LOW (ref 30.0–100.0)

## 2015-05-29 ENCOUNTER — Encounter: Payer: Self-pay | Admitting: Family Medicine

## 2015-05-29 ENCOUNTER — Ambulatory Visit (INDEPENDENT_AMBULATORY_CARE_PROVIDER_SITE_OTHER): Payer: BLUE CROSS/BLUE SHIELD | Admitting: Family Medicine

## 2015-05-29 VITALS — BP 122/83 | HR 62 | Temp 97.2°F | Ht 70.0 in | Wt 226.0 lb

## 2015-05-29 DIAGNOSIS — N5201 Erectile dysfunction due to arterial insufficiency: Secondary | ICD-10-CM

## 2015-05-29 DIAGNOSIS — K219 Gastro-esophageal reflux disease without esophagitis: Secondary | ICD-10-CM | POA: Diagnosis not present

## 2015-05-29 DIAGNOSIS — N529 Male erectile dysfunction, unspecified: Secondary | ICD-10-CM | POA: Insufficient documentation

## 2015-05-29 DIAGNOSIS — N400 Enlarged prostate without lower urinary tract symptoms: Secondary | ICD-10-CM

## 2015-05-29 DIAGNOSIS — E559 Vitamin D deficiency, unspecified: Secondary | ICD-10-CM

## 2015-05-29 DIAGNOSIS — E785 Hyperlipidemia, unspecified: Secondary | ICD-10-CM

## 2015-05-29 DIAGNOSIS — M7711 Lateral epicondylitis, right elbow: Secondary | ICD-10-CM | POA: Diagnosis not present

## 2015-05-29 MED ORDER — TADALAFIL 5 MG PO TABS: 5.0000 mg | ORAL_TABLET | Freq: Every day | ORAL | Status: DC

## 2015-05-29 MED ORDER — VITAMIN D (ERGOCALCIFEROL) 1.25 MG (50000 UNIT) PO CAPS: 50000.0000 [IU] | ORAL_CAPSULE | ORAL | Status: DC

## 2015-05-29 NOTE — Patient Instructions (Addendum)
Continue current medications. Continue good therapeutic lifestyle changes which include good diet and exercise. Fall precautions discussed with patient. If an FOBT was given today- please return it to our front desk. If you are over 61 years old - you may need Prevnar 34 or the adult Pneumonia vaccine.  **Flu shots are available--- please call and schedule a FLU-CLINIC appointment**  After your visit with [place] today you will receive a survey in the mail or online from [employer] regarding your care with [place]. Please take a moment to fill this out. Your feedback is very important to [place] as you can help [place] better understand your patient needs as well as improve your experience and satisfaction. WE CARE ABOUT YOU!!!   Take the Cialis as directed because of the BPH Avoid heavy lifting pushing or pulling for the next few days since we did the injection to the elbow If the elbow does not improve after 2-3 weeks please get back in touch with [place] This winter drink plenty of fluids The gastroenterologist will be calling you regarding the colonoscopy sometime in December

## 2015-05-29 NOTE — Progress Notes (Signed)
Subjective:    Patient ID: Michael Clark, male    DOB: [birth year], 59 y.o.   MRN: [id number]  HPI Pt here for follow up and management of chronic medical problems which includes hyperlipidemia and vit d def. He is taking medications regularly. The patient continues to have problems with his right elbow. He is use elbow braces and take an anti-inflammatory medicines and still has problems with pain radiating up and down from the elbow. He also notes that he has some problems with voiding and a slower stream and more frequency. He denies chest pain or shortness of breath or trouble swallowing or heartburn indigestion nausea vomiting diarrhea or blood in the stool. He does take Prilosec 20 mg regularly and this controls his reflux most the time.       Patient Active Problem List   Diagnosis Date Noted  . GERD (gastroesophageal reflux disease) 10/17/2013  . BPH (benign prostatic hyperplasia) 10/17/2013  . Vitamin D deficiency 10/17/2013  . Hyperlipidemia 10/17/2013   Outpatient Encounter Prescriptions as of 05/29/2015  Medication Sig  . CRESTOR 20 MG tablet Take 1 tablet (20 mg total) by mouth daily.  . fluticasone (FLONASE) 50 MCG/ACT nasal spray Place 2 sprays into the nose daily.    . naproxen sodium (ANAPROX) 220 MG tablet Take 220 mg by mouth 2 (two) times daily with a meal.    . omeprazole (PRILOSEC) 20 MG capsule TAKE ONE (1) CAPSULE EACH DAY  . [DISCONTINUED] azelastine (ASTELIN) 0.1 % nasal spray 1-2 sprays at bedtime ,Use in each nostril as directed  . [DISCONTINUED] Fluticasone Furoate-Vilanterol (BREO ELLIPTA) 100-25 MCG/INH AEPB Inhale 1 Inhaler into the lungs daily.   No facility-administered encounter medications on file as of 05/29/2015.     Review of Systems  Constitutional: Negative.   HENT: Negative.   Eyes: Negative.   Respiratory: Negative.   Cardiovascular: Negative.   Gastrointestinal: Negative.   Endocrine: Negative.   Genitourinary: Negative.     Musculoskeletal: Positive for arthralgias (right elbow pain).  Skin: Negative.   Allergic/Immunologic: Negative.   Neurological: Negative.   Hematological: Negative.   Psychiatric/Behavioral: Negative.        Objective:   Physical Exam  Constitutional: He is oriented to person, place, and time. He appears well-developed and well-nourished. No distress.  HENT:  Head: Normocephalic and atraumatic.  Right Ear: External ear normal.  Left Ear: External ear normal.  Mouth/Throat: Oropharynx is clear and moist. No oropharyngeal exudate.  Nasal congestion bilaterally  Eyes: Conjunctivae and EOM are normal. Pupils are equal, round, and reactive to light. Right eye exhibits no discharge. Left eye exhibits no discharge. No scleral icterus.  Neck: Normal range of motion. Neck supple. No thyromegaly present.  Neck without bruits thyromegaly or anterior cervical adenopathy  Cardiovascular: Normal rate, regular rhythm, normal heart sounds and intact distal pulses.   No murmur heard. Heart is regular at 72/m  Pulmonary/Chest: Effort normal and breath sounds normal. No respiratory distress. He has no wheezes. He has no rales. He exhibits no tenderness.  Clear anteriorly and posteriorly  Abdominal: Soft. Bowel sounds are normal. He exhibits no mass. There is no tenderness. There is no rebound and no guarding.  The abdomen is nontender without masses or bruits or organ enlargement  Musculoskeletal: Normal range of motion. He exhibits tenderness. He exhibits no edema.  There is tenderness to palpation of the right lateral epicondyles. A half a cc of Marcaine and Kenalog were injected into the right lateral epicondyles  after sterile prep and the patient tolerated this procedure well and a pressure dressing was applied afterward.  Lymphadenopathy:    He has no cervical adenopathy.  Neurological: He is alert and oriented to person, place, and time. He has normal reflexes. No cranial nerve deficit.  Skin:  Skin is warm and dry. No rash noted.  Psychiatric: He has a normal mood and affect. His behavior is normal. Judgment and thought content normal.  Nursing note and vitals reviewed.   BP 122/83 mmHg  Pulse 62  Temp(Src) 97.2 F (36.2 C) (Oral)  Ht 5\' 10"  (1.778 m)  Wt 226 lb (102.513 kg)  BMI 32.43 kg/m2       Assessment & Plan:  1. Gastroesophageal reflux disease, esophagitis presence not specified -Continue with Prilosec. The patient may try to reduce his use of Prilosec and start alternating Zantac 150 twice a day with the Prilosec and he will do this to see if he can get off of the Prilosec altogether. He is currently not having any symptoms with the reflux but still has to take the Prilosec regularly.  2. Vitamin D deficiency -He is not taking his vitamin D3 regularly and promises to do this. We will recheck this at the next visit.  3. BPH (benign prostatic hyperplasia) -The patient does have some symptoms of frequency and reduced stream. We will give him a prescription of Cialis 5 mg to see if this helps the symptoms.  4. Hyperlipidemia -The patient went for a period and did not take his Crestor regularly but tried an over-the-counter concoction of cherries and cherry juice. He got tired of doing this and recently restarted his Crestor. This most likely it plain the rise in his cholesterol numbers. He will get back on his Crestor regularly. He'll continue to follow-up aggressive therapeutic lifestyle changes.  5. Right lateral epicondylitis -1/2 mL of Marcaine and 1/2 mL of Kenalog were injected into the right lateral epicondyles without problems after sterile prep and a pressure dressing was applied and the patient tolerated procedure well.  6. Erectile dysfunction -Cialis 5 mg daily  Meds ordered this encounter  Medications  . Vitamin D, Ergocalciferol, (DRISDOL) 50000 UNITS CAPS capsule    Sig: Take 1 capsule (50,000 Units total) by mouth every 7 (seven) days.     Dispense:  12 capsule    Refill:  1  . tadalafil (CIALIS) 5 MG tablet    Sig: Take 1 tablet (5 mg total) by mouth daily.    Dispense:  30 tablet    Refill:  2   Patient Instructions  Continue current medications. Continue good therapeutic lifestyle changes which include good diet and exercise. Fall precautions discussed with patient. If an FOBT was given today- please return it to our front desk. If you are over 36 years old - you may need Prevnar 40 or the adult Pneumonia vaccine.  **Flu shots are available--- please call and schedule a FLU-CLINIC appointment**  After your visit with [place] today you will receive a survey in the mail or online from [employer] regarding your care with [place]. Please take a moment to fill this out. Your feedback is very important to [place] as you can help [place] better understand your patient needs as well as improve your experience and satisfaction. WE CARE ABOUT YOU!!!   Take the Cialis as directed because of the BPH Avoid heavy lifting pushing or pulling for the next few days since we did the injection to the elbow If the  elbow does not improve after 2-3 weeks please get back in touch with [place] This winter drink plenty of fluids The gastroenterologist will be calling you regarding the colonoscopy sometime in December   Arrie Senate MD

## 2015-06-06 ENCOUNTER — Ambulatory Visit: Payer: BLUE CROSS/BLUE SHIELD | Admitting: Family Medicine

## 2015-06-27 ENCOUNTER — Encounter: Payer: Self-pay | Admitting: Family Medicine

## 2015-07-10 ENCOUNTER — Telehealth: Payer: Self-pay | Admitting: *Deleted

## 2015-07-10 NOTE — Telephone Encounter (Signed)
Called patient to schedule a treadmill for December 8th. Patient wants to wait until January or February.

## 2015-08-07 ENCOUNTER — Encounter: Payer: Self-pay | Admitting: Family Medicine

## 2015-08-07 ENCOUNTER — Ambulatory Visit (INDEPENDENT_AMBULATORY_CARE_PROVIDER_SITE_OTHER): Payer: BLUE CROSS/BLUE SHIELD | Admitting: Family Medicine

## 2015-08-07 VITALS — BP 136/89 | HR 74 | Temp 98.4°F | Ht 70.0 in | Wt 223.0 lb

## 2015-08-07 DIAGNOSIS — F411 Generalized anxiety disorder: Secondary | ICD-10-CM | POA: Diagnosis not present

## 2015-08-07 MED ORDER — AMITRIPTYLINE HCL 25 MG PO TABS: 25.0000 mg | ORAL_TABLET | Freq: Every day | ORAL | Status: DC

## 2015-08-07 MED ORDER — BUSPIRONE HCL 7.5 MG PO TABS: 7.5000 mg | ORAL_TABLET | Freq: Two times a day (BID) | ORAL | Status: DC | PRN

## 2015-08-07 NOTE — Progress Notes (Signed)
BP 136/89 mmHg  Pulse 74  Temp(Src) 98.4 F (36.9 C) (Oral)  Ht 5\' 10"  (1.778 m)  Wt 223 lb (101.152 kg)  BMI 32.00 kg/m2   Subjective:    Patient ID: Michael Clark, male    DOB: 1955-10-16, 59 y.o.   MRN: [id number]  HPI: Michael Clark is a 59 y.o. male presenting on 08/07/2015 for Anxiety   HPI Anxiety Patient has been having a lot more anxiety for the past 2-3 weeks since his daughter developed preeclampsia and then gave birth to a very premature child and has been in the NICU at [facility]. His daughters improved and the baby is improving but only weighed 3 pounds and is been extremely anxious about his daughter and his grandchild and what they're going through. He does not have any panic attacks but just feels like his nerves are very stressed and wired. He did take a friend's Xanax and that made him feel slightly better. He is coming today because he feels like he needs something that will help him through this. He denies any depression or thoughts of suicide. He does complain of not being able to sleep at night because of his mind racing and anxious thoughts.  Relevant past medical, surgical, family and social history reviewed and updated as indicated. Interim medical history since our last visit reviewed. Allergies and medications reviewed and updated.  Review of Systems  Constitutional: Negative for fever.  HENT: Negative for ear discharge and ear pain.   Eyes: Negative for discharge and visual disturbance.  Respiratory: Negative for shortness of breath and wheezing.   Cardiovascular: Negative for chest pain and leg swelling.  Gastrointestinal: Negative for abdominal pain, diarrhea and constipation.  Genitourinary: Negative for difficulty urinating.  Musculoskeletal: Negative for back pain and gait problem.  Skin: Negative for rash.  Neurological: Negative for syncope, light-headedness and headaches.  Psychiatric/Behavioral: Positive for sleep disturbance and  decreased concentration. Negative for suicidal ideas, self-injury and dysphoric mood. The patient is nervous/anxious.   All other systems reviewed and are negative.   Per HPI unless specifically indicated above     Medication List       This list is accurate as of: 08/07/15  4:35 PM.  Always use your most recent med list.               amitriptyline 25 MG tablet  Commonly known as:  ELAVIL  Take 1 tablet (25 mg total) by mouth at bedtime.     busPIRone 7.5 MG tablet  Commonly known as:  BUSPAR  Take 1 tablet (7.5 mg total) by mouth 2 (two) times daily as needed.     CRESTOR 20 MG tablet  Generic drug:  rosuvastatin  Take 1 tablet (20 mg total) by mouth daily.     fluticasone 50 MCG/ACT nasal spray  Commonly known as:  FLONASE  Place 2 sprays into the nose daily. As needed     naproxen sodium 220 MG tablet  Commonly known as:  ANAPROX  Take 220 mg by mouth 2 (two) times daily with a meal.     omeprazole 20 MG capsule  Commonly known as:  PRILOSEC  TAKE ONE (1) CAPSULE EACH DAY     tadalafil 5 MG tablet  Commonly known as:  CIALIS  Take 1 tablet (5 mg total) by mouth daily.     Vitamin D (Ergocalciferol) 50000 units Caps capsule  Commonly known as:  DRISDOL  Take 1 capsule (50,000 Units  total) by mouth every 7 (seven) days.           Objective:    BP 136/89 mmHg  Pulse 74  Temp(Src) 98.4 F (36.9 C) (Oral)  Ht 5\' 10"  (1.778 m)  Wt 223 lb (101.152 kg)  BMI 32.00 kg/m2  Wt Readings from Last 3 Encounters:  08/07/15 223 lb (101.152 kg)  05/29/15 226 lb (102.513 kg)  10/23/14 233 lb (105.688 kg)    Physical Exam  Constitutional: He is oriented to person, place, and time. He appears well-developed and well-nourished. No distress.  Eyes: Conjunctivae and EOM are normal. Pupils are equal, round, and reactive to light. Right eye exhibits no discharge. No scleral icterus.  Cardiovascular: Normal rate, regular rhythm, normal heart sounds and intact distal  pulses.   No murmur heard. Pulmonary/Chest: Effort normal and breath sounds normal. No respiratory distress. He has no wheezes.  Abdominal: He exhibits no distension.  Musculoskeletal: Normal range of motion. He exhibits no edema.  Neurological: He is alert and oriented to person, place, and time. Coordination normal.  Skin: Skin is warm and dry. No rash noted. He is not diaphoretic.  Psychiatric: His speech is normal and behavior is normal. Judgment and thought content normal. His mood appears anxious. His affect is blunt. His affect is not angry, not labile and not inappropriate. He does not exhibit a depressed mood. He expresses no suicidal ideation. He expresses no suicidal plans.  Vitals reviewed.  Assessment & Plan:   Problem List Items Addressed This Visit    None    Visit Diagnoses    GAD (generalized anxiety disorder)    -  Primary    Relevant Medications    amitriptyline (ELAVIL) 25 MG tablet    busPIRone (BUSPAR) 7.5 MG tablet        Follow up plan: Return in about 4 weeks (around 09/04/2015), or if symptoms worsen or fail to improve, for f/u anxiety.  Counseling provided for all of the vaccine components No orders of the defined types were placed in this encounter.    Caryl Pina, MD [facility] 08/07/2015, 4:35 PM

## 2015-09-18 ENCOUNTER — Encounter (INDEPENDENT_AMBULATORY_CARE_PROVIDER_SITE_OTHER): Payer: BLUE CROSS/BLUE SHIELD

## 2015-09-18 ENCOUNTER — Other Ambulatory Visit: Payer: Self-pay | Admitting: *Deleted

## 2015-09-18 DIAGNOSIS — E785 Hyperlipidemia, unspecified: Secondary | ICD-10-CM | POA: Diagnosis not present

## 2015-09-18 LAB — EXERCISE TOLERANCE TEST
Estimated workload: 7.9 METS
Exercise duration (min): 6 min
Exercise duration (sec): 56 s
MPHR: 159 {beats}/min
Peak HR: 147 {beats}/min
Percent HR: 92 %
RPE: 5
Rest HR: 99 {beats}/min

## 2015-09-25 ENCOUNTER — Encounter: Payer: Self-pay | Admitting: Family Medicine

## 2015-10-03 ENCOUNTER — Ambulatory Visit (INDEPENDENT_AMBULATORY_CARE_PROVIDER_SITE_OTHER): Payer: BLUE CROSS/BLUE SHIELD | Admitting: Family Medicine

## 2015-10-03 ENCOUNTER — Encounter: Payer: Self-pay | Admitting: Family Medicine

## 2015-10-03 VITALS — BP 116/80 | HR 79 | Temp 100.2°F | Ht 70.0 in | Wt 218.8 lb

## 2015-10-03 DIAGNOSIS — J111 Influenza due to unidentified influenza virus with other respiratory manifestations: Secondary | ICD-10-CM | POA: Diagnosis not present

## 2015-10-03 DIAGNOSIS — R52 Pain, unspecified: Secondary | ICD-10-CM

## 2015-10-03 LAB — POCT INFLUENZA A/B
Influenza A, POC: NEGATIVE
Influenza B, POC: NEGATIVE

## 2015-10-03 MED ORDER — OSELTAMIVIR PHOSPHATE 75 MG PO CAPS
75.0000 mg | ORAL_CAPSULE | Freq: Two times a day (BID) | ORAL | Status: DC
Start: 2015-10-03 — End: 2015-11-28

## 2015-10-03 NOTE — Progress Notes (Signed)
BP 116/80 mmHg  Pulse 79  Temp(Src) 100.2 F (37.9 C) (Oral)  Ht 5\' 10"  (1.778 m)  Wt 218 lb 12.8 oz (99.247 kg)  BMI 31.39 kg/m2   Subjective:    Patient ID: Michael Clark, male    DOB: 01/17/1956, 60 y.o.   MRN: [id number]  HPI: Michael Clark is a 60 y.o. male presenting on 10/03/2015 for Cough; Sinusitis; Chest congestion; and Generalized Body Aches   HPI Cough and chest congestion and aches and fevers Patient has been having cough and congestion and fevers and aches and chills for the past 2 days. Last night was the highest he has fever of 102. His cough has been productive of yellow-green sputum. He denies any shortness of breath or wheezing. He is having a lot of nasal congestion and postnasal drainage. He denies any issues with seizures. He has been using either Profen and Flonase and Tylenol.  Relevant past medical, surgical, family and social history reviewed and updated as indicated. Interim medical history since our last visit reviewed. Allergies and medications reviewed and updated.  Review of Systems  Constitutional: Positive for fever and chills.  HENT: Positive for congestion, postnasal drip, rhinorrhea, sinus pressure and sore throat. Negative for ear discharge, ear pain, sneezing and voice change.   Eyes: Negative for pain, discharge, redness and visual disturbance.  Respiratory: Positive for cough. Negative for chest tightness, shortness of breath and wheezing.   Cardiovascular: Negative for chest pain and leg swelling.  Gastrointestinal: Negative for abdominal pain, diarrhea and constipation.  Genitourinary: Negative for difficulty urinating.  Musculoskeletal: Negative for back pain and gait problem.  Skin: Negative for rash.  Neurological: Negative for syncope, light-headedness and headaches.  All other systems reviewed and are negative.   Per HPI unless specifically indicated above     Medication List       This list is accurate as of: 10/03/15   3:24 PM.  Always use your most recent med list.               amitriptyline 25 MG tablet  Commonly known as:  ELAVIL  Take 1 tablet (25 mg total) by mouth at bedtime.     busPIRone 7.5 MG tablet  Commonly known as:  BUSPAR  Take 1 tablet (7.5 mg total) by mouth 2 (two) times daily as needed.     CRESTOR 20 MG tablet  Generic drug:  rosuvastatin  Take 1 tablet (20 mg total) by mouth daily.     fluticasone 50 MCG/ACT nasal spray  Commonly known as:  FLONASE  Place 2 sprays into the nose daily. As needed     naproxen sodium 220 MG tablet  Commonly known as:  ANAPROX  Take 220 mg by mouth 2 (two) times daily with a meal.     omeprazole 20 MG capsule  Commonly known as:  PRILOSEC  TAKE ONE (1) CAPSULE EACH DAY     oseltamivir 75 MG capsule  Commonly known as:  TAMIFLU  Take 1 capsule (75 mg total) by mouth 2 (two) times daily.     tadalafil 5 MG tablet  Commonly known as:  CIALIS  Take 1 tablet (5 mg total) by mouth daily.     Vitamin D (Ergocalciferol) 50000 units Caps capsule  Commonly known as:  DRISDOL  Take 1 capsule (50,000 Units total) by mouth every 7 (seven) days.           Objective:    BP 116/80 mmHg  Pulse 79  Temp(Src) 100.2 F (37.9 C) (Oral)  Ht 5\' 10"  (1.778 m)  Wt 218 lb 12.8 oz (99.247 kg)  BMI 31.39 kg/m2  Wt Readings from Last 3 Encounters:  10/03/15 218 lb 12.8 oz (99.247 kg)  08/07/15 223 lb (101.152 kg)  05/29/15 226 lb (102.513 kg)    Physical Exam  Constitutional: He is oriented to person, place, and time. He appears well-developed and well-nourished. No distress.  HENT:  Right Ear: Tympanic membrane, external ear and ear canal normal.  Left Ear: Tympanic membrane, external ear and ear canal normal.  Nose: Mucosal edema and rhinorrhea present. No sinus tenderness. No epistaxis. Right sinus exhibits maxillary sinus tenderness. Right sinus exhibits no frontal sinus tenderness. Left sinus exhibits maxillary sinus tenderness. Left sinus  exhibits no frontal sinus tenderness.  Mouth/Throat: Uvula is midline and mucous membranes are normal. Posterior oropharyngeal edema and posterior oropharyngeal erythema present. No oropharyngeal exudate or tonsillar abscesses.  Eyes: Conjunctivae and EOM are normal. Pupils are equal, round, and reactive to light. Right eye exhibits no discharge. No scleral icterus.  Neck: Neck supple. No thyromegaly present.  Cardiovascular: Normal rate, regular rhythm, normal heart sounds and intact distal pulses.   No murmur heard. Pulmonary/Chest: Effort normal and breath sounds normal. No respiratory distress. He has no wheezes.  Musculoskeletal: Normal range of motion. He exhibits no edema.  Lymphadenopathy:    He has no cervical adenopathy.  Neurological: He is alert and oriented to person, place, and time. Coordination normal.  Skin: Skin is warm and dry. No rash noted. He is not diaphoretic.  Psychiatric: He has a normal mood and affect. His behavior is normal.  Vitals reviewed.   Results for orders placed or performed in visit on 10/03/15  POCT Influenza A/B  Result Value Ref Range   Influenza A, POC Negative Negative   Influenza B, POC Negative Negative      Assessment & Plan:       Problem List Items Addressed This Visit    None    Visit Diagnoses    Body aches    -  Primary    Relevant Medications    oseltamivir (TAMIFLU) 75 MG capsule    Other Relevant Orders    POCT Influenza A/B (Completed)    Influenza with respiratory manifestation        Relevant Medications    oseltamivir (TAMIFLU) 75 MG capsule        Follow up plan: Return if symptoms worsen or fail to improve.  Counseling provided for all of the vaccine components Orders Placed This Encounter  Procedures  . POCT Influenza A/B    Caryl Pina, MD [facility] 10/03/2015, 3:24 PM

## 2015-11-11 DIAGNOSIS — E782 Mixed hyperlipidemia: Secondary | ICD-10-CM | POA: Diagnosis not present

## 2015-11-11 DIAGNOSIS — E669 Obesity, unspecified: Secondary | ICD-10-CM | POA: Diagnosis not present

## 2015-11-11 DIAGNOSIS — R7301 Impaired fasting glucose: Secondary | ICD-10-CM | POA: Diagnosis not present

## 2015-11-11 DIAGNOSIS — I10 Essential (primary) hypertension: Secondary | ICD-10-CM | POA: Diagnosis not present

## 2015-11-26 ENCOUNTER — Other Ambulatory Visit: Payer: BLUE CROSS/BLUE SHIELD

## 2015-11-26 DIAGNOSIS — K219 Gastro-esophageal reflux disease without esophagitis: Secondary | ICD-10-CM

## 2015-11-26 DIAGNOSIS — Z0000 Encounter for general adult medical examination without abnormal findings: Secondary | ICD-10-CM

## 2015-11-26 DIAGNOSIS — E785 Hyperlipidemia, unspecified: Secondary | ICD-10-CM

## 2015-11-26 DIAGNOSIS — E559 Vitamin D deficiency, unspecified: Secondary | ICD-10-CM

## 2015-11-26 DIAGNOSIS — N400 Enlarged prostate without lower urinary tract symptoms: Secondary | ICD-10-CM | POA: Diagnosis not present

## 2015-11-27 LAB — CBC WITH DIFFERENTIAL/PLATELET
Basophils Absolute: 0 10*3/uL (ref 0.0–0.2)
Basos: 0 %
EOS (ABSOLUTE): 0.1 10*3/uL (ref 0.0–0.4)
Eos: 2 %
Hematocrit: 43 % (ref 37.5–51.0)
Hemoglobin: 14.8 g/dL (ref 12.6–17.7)
Immature Grans (Abs): 0 10*3/uL (ref 0.0–0.1)
Immature Granulocytes: 0 %
Lymphocytes Absolute: 2.3 10*3/uL (ref 0.7–3.1)
Lymphs: 45 %
MCH: 31.1 pg (ref 26.6–33.0)
MCHC: 34.4 g/dL (ref 31.5–35.7)
MCV: 90 fL (ref 79–97)
Monocytes Absolute: 0.4 10*3/uL (ref 0.1–0.9)
Monocytes: 8 %
Neutrophils Absolute: 2.3 10*3/uL (ref 1.4–7.0)
Neutrophils: 45 %
Platelets: 230 10*3/uL (ref 150–379)
RBC: 4.76 x10E6/uL (ref 4.14–5.80)
RDW: 13.1 % (ref 12.3–15.4)
WBC: 5.1 10*3/uL (ref 3.4–10.8)

## 2015-11-27 LAB — PSA, TOTAL AND FREE
PSA, Free Pct: 27.5 %
PSA, Free: 0.44 ng/mL
Prostate Specific Ag, Serum: 1.6 ng/mL (ref 0.0–4.0)

## 2015-11-27 LAB — NMR, LIPOPROFILE
Cholesterol: 147 mg/dL (ref 100–199)
HDL Cholesterol by NMR: 45 mg/dL (ref >39)
HDL Particle Number: 33.4 umol/L (ref >=30.5)
LDL Particle Number: 857 nmol/L (ref <1000)
LDL Size: 20.3 nm (ref >20.5)
LDL-C: 66 mg/dL (ref 0–99)
LP-IR Score: 62 — ABNORMAL HIGH (ref <=45)
Small LDL Particle Number: 458 nmol/L (ref <=527)
Triglycerides by NMR: 182 mg/dL — ABNORMAL HIGH (ref 0–149)

## 2015-11-27 LAB — HEPATIC FUNCTION PANEL
ALT: 32 IU/L (ref 0–44)
AST: 28 IU/L (ref 0–40)
Albumin: 4.2 g/dL (ref 3.6–4.8)
Alkaline Phosphatase: 59 IU/L (ref 39–117)
Bilirubin Total: 0.8 mg/dL (ref 0.0–1.2)
Bilirubin, Direct: 0.21 mg/dL (ref 0.00–0.40)
Total Protein: 6.7 g/dL (ref 6.0–8.5)

## 2015-11-27 LAB — BMP8+EGFR
BUN/Creatinine Ratio: 11 (ref 10–24)
BUN: 11 mg/dL (ref 8–27)
CO2: 22 mmol/L (ref 18–29)
Calcium: 9.7 mg/dL (ref 8.6–10.2)
Chloride: 100 mmol/L (ref 96–106)
Creatinine, Ser: 1 mg/dL (ref 0.76–1.27)
GFR calc Af Amer: 93 mL/min/{1.73_m2} (ref >59)
GFR calc non Af Amer: 80 mL/min/{1.73_m2} (ref >59)
Glucose: 105 mg/dL — ABNORMAL HIGH (ref 65–99)
Potassium: 4.2 mmol/L (ref 3.5–5.2)
Sodium: 140 mmol/L (ref 134–144)

## 2015-11-27 LAB — VITAMIN D 25 HYDROXY (VIT D DEFICIENCY, FRACTURES): Vit D, 25-Hydroxy: 37.9 ng/mL (ref 30.0–100.0)

## 2015-11-28 ENCOUNTER — Ambulatory Visit (INDEPENDENT_AMBULATORY_CARE_PROVIDER_SITE_OTHER): Payer: BLUE CROSS/BLUE SHIELD | Admitting: Family Medicine

## 2015-11-28 ENCOUNTER — Encounter: Payer: Self-pay | Admitting: Family Medicine

## 2015-11-28 VITALS — BP 120/79 | HR 79 | Temp 97.6°F | Ht 70.0 in | Wt 221.0 lb

## 2015-11-28 DIAGNOSIS — Z0000 Encounter for general adult medical examination without abnormal findings: Secondary | ICD-10-CM | POA: Diagnosis not present

## 2015-11-28 DIAGNOSIS — N400 Enlarged prostate without lower urinary tract symptoms: Secondary | ICD-10-CM

## 2015-11-28 DIAGNOSIS — K219 Gastro-esophageal reflux disease without esophagitis: Secondary | ICD-10-CM

## 2015-11-28 DIAGNOSIS — E559 Vitamin D deficiency, unspecified: Secondary | ICD-10-CM

## 2015-11-28 DIAGNOSIS — E785 Hyperlipidemia, unspecified: Secondary | ICD-10-CM

## 2015-11-28 DIAGNOSIS — J301 Allergic rhinitis due to pollen: Secondary | ICD-10-CM

## 2015-11-28 LAB — MICROSCOPIC EXAMINATION
Bacteria, UA: NONE SEEN
Epithelial Cells (non renal): NONE SEEN /hpf (ref 0–10)
RBC, UA: NONE SEEN /hpf (ref 0–?)
WBC, UA: NONE SEEN /hpf (ref 0–?)

## 2015-11-28 LAB — URINALYSIS, COMPLETE
Bilirubin, UA: NEGATIVE
Glucose, UA: NEGATIVE
Ketones, UA: NEGATIVE
Leukocytes, UA: NEGATIVE
Nitrite, UA: NEGATIVE
Protein, UA: NEGATIVE
RBC, UA: NEGATIVE
Specific Gravity, UA: 1.02 (ref 1.005–1.030)
Urobilinogen, Ur: 0.2 mg/dL (ref 0.2–1.0)
pH, UA: 8 — ABNORMAL HIGH (ref 5.0–7.5)

## 2015-11-28 MED ORDER — FLUTICASONE PROPIONATE 50 MCG/ACT NA SUSP: 2.0000 | Freq: Every day | NASAL | Status: DC

## 2015-11-28 MED ORDER — VITAMIN D (ERGOCALCIFEROL) 1.25 MG (50000 UNIT) PO CAPS: 50000.0000 [IU] | ORAL_CAPSULE | ORAL | Status: DC

## 2015-11-28 NOTE — Progress Notes (Signed)
Subjective:    Patient ID: Michael Clark, male    DOB: 1956/05/29, 60 y.o.   MRN: [id number]  HPI Patient is here today for annual wellness exam and follow up of chronic medical problems which includes hyperlipidemia. He is taking medications regualrly.The patient is requesting a refill on his Flonase. He also complains of some soreness in the left side of his neck. He's had his lab work and we will review this with him today. Overall all of his numbers were good for renal function and liver function cholesterol and PSA and CBC. The blood sugar was slightly elevated. The patient did have a colonoscopy in March. Colonoscopy was done by Dr. Amedeo Plenty and this was normal and the next colonoscopy is planned for 10 years out. The patient denies any chest pain or shortness of breath. He does have occasional heartburn and he says that Pepcid is working to take care of this. He is not seeing any blood in the stool or had any black tarry bowel movements. His bowel habits are stable and stools are normal in appearance. His voiding symptoms have some slowness with passing his water and he has nocturia occasionally 1 at nighttime. He is due to get an eye exam and he plans to schedule this. There is a family history of dementia and we had a long talk about avoiding sedating antihistamines and avoiding diet drinks.     Patient Active Problem List   Diagnosis Date Noted  . Erectile dysfunction 05/29/2015  . GERD (gastroesophageal reflux disease) 10/17/2013  . BPH (benign prostatic hyperplasia) 10/17/2013  . Vitamin D deficiency 10/17/2013  . Hyperlipidemia 10/17/2013   Outpatient Encounter Prescriptions as of 11/28/2015  Medication Sig  . cholecalciferol (VITAMIN D) 1000 units tablet Take 1,000 Units by mouth daily.  . CRESTOR 20 MG tablet Take 1 tablet (20 mg total) by mouth daily.  . fluticasone (FLONASE) 50 MCG/ACT nasal spray Place 2 sprays into the nose daily. As needed  . naproxen sodium (ANAPROX) 220  MG tablet Take 220 mg by mouth 2 (two) times daily with a meal.    . tadalafil (CIALIS) 5 MG tablet Take 1 tablet (5 mg total) by mouth daily.  . [DISCONTINUED] amitriptyline (ELAVIL) 25 MG tablet Take 1 tablet (25 mg total) by mouth at bedtime. (Patient not taking: Reported on 10/03/2015)  . [DISCONTINUED] busPIRone (BUSPAR) 7.5 MG tablet Take 1 tablet (7.5 mg total) by mouth 2 (two) times daily as needed. (Patient not taking: Reported on 10/03/2015)  . [DISCONTINUED] omeprazole (PRILOSEC) 20 MG capsule TAKE ONE (1) CAPSULE EACH DAY (Patient not taking: Reported on 10/03/2015)  . [DISCONTINUED] oseltamivir (TAMIFLU) 75 MG capsule Take 1 capsule (75 mg total) by mouth 2 (two) times daily.  . [DISCONTINUED] Vitamin D, Ergocalciferol, (DRISDOL) 50000 UNITS CAPS capsule Take 1 capsule (50,000 Units total) by mouth every 7 (seven) days.   No facility-administered encounter medications on file as of 11/28/2015.     Review of Systems  Constitutional: Negative.   HENT: Negative.   Eyes: Negative.   Respiratory: Negative.   Cardiovascular: Negative.   Gastrointestinal: Negative.   Endocrine: Negative.   Genitourinary: Negative.   Musculoskeletal: Positive for neck pain (right side - front soreness).  Skin: Negative.   Allergic/Immunologic: Negative.   Neurological: Negative.   Hematological: Negative.   Psychiatric/Behavioral: Negative.        Objective:   Physical Exam  Constitutional: He is oriented to person, place, and time. He appears well-developed and well-nourished.  No distress.  Alert and pleasant  HENT:  Head: Normocephalic and atraumatic.  Right Ear: External ear normal.  Left Ear: External ear normal.  Mouth/Throat: Oropharynx is clear and moist. No oropharyngeal exudate.  Nasal turbinate congestion and swelling  Eyes: Conjunctivae and EOM are normal. Pupils are equal, round, and reactive to light. Right eye exhibits no discharge. Left eye exhibits no discharge. No scleral  icterus.  Neck: Normal range of motion. Neck supple. No thyromegaly present.  Scar tissue on the left and this is where the soreness occurs occasionally. There is no adenopathy and no thyromegaly  Cardiovascular: Normal rate, regular rhythm, normal heart sounds and intact distal pulses.   No murmur heard. Heart is regular at 72/m  Pulmonary/Chest: Effort normal and breath sounds normal. No respiratory distress. He has no wheezes. He has no rales. He exhibits no tenderness.  Clear anteriorly and posteriorly  Abdominal: Soft. Bowel sounds are normal. He exhibits no mass. There is no tenderness. There is no rebound and no guarding.  Nontender without liver or spleen enlargement or masses or bruits  Genitourinary: Rectum normal and penis normal.  The prostate is enlarged but soft and smooth. There are no rectal masses. There are no inguinal hernias palpable. The external genitalia were within normal limits.  Musculoskeletal: Normal range of motion. He exhibits no edema or tenderness.  Lymphadenopathy:    He has no cervical adenopathy.  Neurological: He is alert and oriented to person, place, and time. He has normal reflexes. No cranial nerve deficit.  Skin: Skin is warm and dry. No rash noted.  Psychiatric: He has a normal mood and affect. His behavior is normal. Judgment and thought content normal.  Nursing note and vitals reviewed.  BP 120/79 mmHg  Pulse 79  Temp(Src) 97.6 F (36.4 C) (Oral)  Ht 5\' 10"  (1.778 m)  Wt 221 lb (100.245 kg)  BMI 31.71 kg/m2        Assessment & Plan:  1. Annual physical exam -The patient recently had a colonoscopy that was within normal limits and another one will not be scheduled for 10 years. - Urinalysis, Complete  2. Hyperlipidemia -The patient's cholesterol numbers were excellent and he will continue with current treatment. His triglycerides were slightly elevated he will continue with aggressive therapeutic lifestyle changes.  3. Vitamin D  deficiency -The vitamin D level was good. We will call in a prescription for vitamin D 50,000 units he will take 1 weekly indefinitely  4. Gastroesophageal reflux disease, esophagitis presence not specified -Continue with Pepcid before meals  5. BPH (benign prostatic hyperplasia) -The prostate is enlarged but soft and smooth and the PSA is stable.  6. Allergic rhinitis due to pollen -Continue with Flonase and nonsedating antihistamines  Meds ordered this encounter  Medications  . cholecalciferol (VITAMIN D) 1000 units tablet    Sig: Take 1,000 Units by mouth daily.  . fluticasone (FLONASE) 50 MCG/ACT nasal spray    Sig: Place 2 sprays into both nostrils daily. As needed    Dispense:  16 g    Refill:  11  . Vitamin D, Ergocalciferol, (DRISDOL) 50000 units CAPS capsule    Sig: Take 1 capsule (50,000 Units total) by mouth every 7 (seven) days.    Dispense:  12 capsule    Refill:  1   Patient Instructions                       Medicare Annual Wellness Visit  [facility]  and the medical providers at [facility] strive to bring you the best medical care.  In doing so we not only want to address your current medical conditions and concerns but also to detect new conditions early and prevent illness, disease and health-related problems.    Medicare offers a yearly Wellness Visit which allows our clinical staff to assess your need for preventative services including immunizations, lifestyle education, counseling to decrease risk of preventable diseases and screening for fall risk and other medical concerns.    This visit is provided free of charge (no copay) for all Medicare recipients. The clinical pharmacists at [facility] have begun to conduct these Wellness Visits which will also include a thorough review of all your medications.    As you primary medical provider recommend that you make an appointment for your Annual Wellness Visit if you  have not done so already this year.  You may set up this appointment before you leave today or you may call back [phone] and schedule an appointment.  Please make sure when you call that you mention that you are scheduling your Annual Wellness Visit with the clinical pharmacist so that the appointment may be made for the proper length of time.    Continue current medications. Continue good therapeutic lifestyle changes which include good diet and exercise. Fall precautions discussed with patient. If an FOBT was given today- please return it to our front desk. If you are over 33 years old - you may need Prevnar 20 or the adult Pneumonia vaccine.  **Flu shots are available--- please call and schedule a FLU-CLINIC appointment**  After your visit with [place] today you will receive a survey in the mail or online from [employer] regarding your care with [place]. Please take a moment to fill this out. Your feedback is very important to [place] as you can help [place] better understand your patient needs as well as improve your experience and satisfaction. WE CARE ABOUT YOU!!!   The patient should avoid sedating antihistamines as these increased the risk of dementia The patient should take his Pepcid regularly and if he continues to have heartburn he should get back in touch with [place] The patient should be reminded to get his eye exam and should get this regularly. He is to be applauded for his good cholesterol numbers but should continue to watch his diet closely and exercise as much as possible. Be sure and use tick repellent sprays when out in the woods   Arrie Senate MD

## 2015-11-28 NOTE — Patient Instructions (Addendum)
Medicare Annual Wellness Visit  [facility] and the medical providers at [facility] strive to bring you the best medical care.  In doing so we not only want to address your current medical conditions and concerns but also to detect new conditions early and prevent illness, disease and health-related problems.    Medicare offers a yearly Wellness Visit which allows our clinical staff to assess your need for preventative services including immunizations, lifestyle education, counseling to decrease risk of preventable diseases and screening for fall risk and other medical concerns.    This visit is provided free of charge (no copay) for all Medicare recipients. The clinical pharmacists at [facility] have begun to conduct these Wellness Visits which will also include a thorough review of all your medications.    As you primary medical provider recommend that you make an appointment for your Annual Wellness Visit if you have not done so already this year.  You may set up this appointment before you leave today or you may call back [phone] and schedule an appointment.  Please make sure when you call that you mention that you are scheduling your Annual Wellness Visit with the clinical pharmacist so that the appointment may be made for the proper length of time.    Continue current medications. Continue good therapeutic lifestyle changes which include good diet and exercise. Fall precautions discussed with patient. If an FOBT was given today- please return it to our front desk. If you are over 49 years old - you may need Prevnar 36 or the adult Pneumonia vaccine.  **Flu shots are available--- please call and schedule a FLU-CLINIC appointment**  After your visit with [place] today you will receive a survey in the mail or online from [employer] regarding your care with [place]. Please take a moment to fill this out. Your feedback is very  important to [place] as you can help [place] better understand your patient needs as well as improve your experience and satisfaction. WE CARE ABOUT YOU!!!   The patient should avoid sedating antihistamines as these increased the risk of dementia The patient should take his Pepcid regularly and if he continues to have heartburn he should get back in touch with [place] The patient should be reminded to get his eye exam and should get this regularly. He is to be applauded for his good cholesterol numbers but should continue to watch his diet closely and exercise as much as possible. Be sure and use tick repellent sprays when out in the woods

## 2015-12-22 DIAGNOSIS — H6981 Other specified disorders of Eustachian tube, right ear: Secondary | ICD-10-CM | POA: Diagnosis not present

## 2015-12-22 DIAGNOSIS — J309 Allergic rhinitis, unspecified: Secondary | ICD-10-CM | POA: Diagnosis not present

## 2016-02-02 DIAGNOSIS — D225 Melanocytic nevi of trunk: Secondary | ICD-10-CM | POA: Diagnosis not present

## 2016-02-02 DIAGNOSIS — L821 Other seborrheic keratosis: Secondary | ICD-10-CM | POA: Diagnosis not present

## 2016-02-02 DIAGNOSIS — X32XXXD Exposure to sunlight, subsequent encounter: Secondary | ICD-10-CM | POA: Diagnosis not present

## 2016-02-02 DIAGNOSIS — C44319 Basal cell carcinoma of skin of other parts of face: Secondary | ICD-10-CM | POA: Diagnosis not present

## 2016-02-02 DIAGNOSIS — C44311 Basal cell carcinoma of skin of nose: Secondary | ICD-10-CM | POA: Diagnosis not present

## 2016-02-02 DIAGNOSIS — L570 Actinic keratosis: Secondary | ICD-10-CM | POA: Diagnosis not present

## 2016-03-04 DIAGNOSIS — L570 Actinic keratosis: Secondary | ICD-10-CM | POA: Diagnosis not present

## 2016-03-04 DIAGNOSIS — X32XXXD Exposure to sunlight, subsequent encounter: Secondary | ICD-10-CM | POA: Diagnosis not present

## 2016-03-04 DIAGNOSIS — Z08 Encounter for follow-up examination after completed treatment for malignant neoplasm: Secondary | ICD-10-CM | POA: Diagnosis not present

## 2016-03-04 DIAGNOSIS — Z85828 Personal history of other malignant neoplasm of skin: Secondary | ICD-10-CM | POA: Diagnosis not present

## 2016-05-31 ENCOUNTER — Encounter: Payer: Self-pay | Admitting: Family Medicine

## 2016-05-31 ENCOUNTER — Ambulatory Visit (INDEPENDENT_AMBULATORY_CARE_PROVIDER_SITE_OTHER): Payer: BLUE CROSS/BLUE SHIELD | Admitting: Family Medicine

## 2016-05-31 VITALS — BP 114/78 | HR 56 | Temp 97.6°F | Ht 70.0 in | Wt 227.0 lb

## 2016-05-31 DIAGNOSIS — E7800 Pure hypercholesterolemia, unspecified: Secondary | ICD-10-CM

## 2016-05-31 DIAGNOSIS — N400 Enlarged prostate without lower urinary tract symptoms: Secondary | ICD-10-CM

## 2016-05-31 DIAGNOSIS — K219 Gastro-esophageal reflux disease without esophagitis: Secondary | ICD-10-CM

## 2016-05-31 DIAGNOSIS — E785 Hyperlipidemia, unspecified: Secondary | ICD-10-CM | POA: Diagnosis not present

## 2016-05-31 DIAGNOSIS — E559 Vitamin D deficiency, unspecified: Secondary | ICD-10-CM

## 2016-05-31 NOTE — Progress Notes (Signed)
Subjective:    Patient ID: Michael Clark, male    DOB: [birth year], 60 y.o.   MRN: [id number]  HPI Pt here for follow up and management of chronic medical problems which includes hyperlipidemia. He is taking medications regularly.The patient has no complaints and is doing well overall. He has a history of hyperlipidemia erectile dysfunction and BPH. He also has a history of GERD and vitamin D deficiency. He is taking Crestor for his hyperlipidemia and takes 50,000 units of vitamin D weekly. The patient denies any chest pain palpitations shortness of breath. He is active physically. He's had a stress test in the past year or so. He is also had a colonoscopy this year and everything was good and will be repeated in 10 years. He has no trouble with swallowing and minimal problems with heartburn taking 2 Pepcid a cc daily. He denies blood in the stool or black tarry bowel movements. He has occasional nocturia. He's passing his water well. The Cialis works well for him.    Patient Active Problem List   Diagnosis Date Noted  . Erectile dysfunction 05/29/2015  . GERD (gastroesophageal reflux disease) 10/17/2013  . BPH (benign prostatic hyperplasia) 10/17/2013  . Vitamin D deficiency 10/17/2013  . Hyperlipidemia 10/17/2013   Outpatient Encounter Prescriptions as of 05/31/2016  Medication Sig  . cholecalciferol (VITAMIN D) 1000 units tablet Take 1,000 Units by mouth daily.  . CRESTOR 20 MG tablet Take 1 tablet (20 mg total) by mouth daily.  . famotidine (PEPCID) 40 MG tablet Take 40 mg by mouth daily.  . fluticasone (FLONASE) 50 MCG/ACT nasal spray Place 2 sprays into both nostrils daily. As needed  . naproxen sodium (ANAPROX) 220 MG tablet Take 220 mg by mouth 2 (two) times daily with a meal.    . tadalafil (CIALIS) 5 MG tablet Take 1 tablet (5 mg total) by mouth daily.  . [DISCONTINUED] Vitamin D, Ergocalciferol, (DRISDOL) 50000 units CAPS capsule Take 1 capsule (50,000 Units total) by mouth  every 7 (seven) days.   No facility-administered encounter medications on file as of 05/31/2016.       Review of Systems  Constitutional: Negative.   HENT: Negative.   Eyes: Negative.   Respiratory: Negative.   Cardiovascular: Negative.   Gastrointestinal: Negative.   Endocrine: Negative.   Genitourinary: Negative.   Musculoskeletal: Negative.   Skin: Negative.   Allergic/Immunologic: Negative.   Neurological: Negative.   Hematological: Negative.   Psychiatric/Behavioral: Negative.        Objective:   Physical Exam  Constitutional: He is oriented to person, place, and time. He appears well-developed and well-nourished. No distress.  HENT:  Head: Normocephalic and atraumatic.  Right Ear: External ear normal.  Left Ear: External ear normal.  Mouth/Throat: Oropharynx is clear and moist. No oropharyngeal exudate.  Slight nasal congestion left greater than right  Eyes: Conjunctivae and EOM are normal. Pupils are equal, round, and reactive to light. Right eye exhibits no discharge. Left eye exhibits no discharge. No scleral icterus.  Neck: Normal range of motion. Neck supple. No thyromegaly present.  No bruits or thyromegaly or anterior cervical adenopathy  Cardiovascular: Normal rate, regular rhythm, normal heart sounds and intact distal pulses.   No murmur heard. The heart has a regular rate and rhythm at 60-72/m  Pulmonary/Chest: Effort normal and breath sounds normal. No respiratory distress. He has no wheezes. He has no rales.  No axillary adenopathy  Abdominal: Soft. Bowel sounds are normal. He exhibits no mass. There  is no tenderness. There is no rebound and no guarding.  No epigastric tenderness liver or spleen enlargement masses or bruits  Musculoskeletal: Normal range of motion. He exhibits no edema.  Lymphadenopathy:    He has no cervical adenopathy.  Neurological: He is alert and oriented to person, place, and time. He has normal reflexes. No cranial nerve deficit.   Skin: Skin is warm and dry. No rash noted.  Cryotherapy to seborrheic keratosis on back without problems  Psychiatric: He has a normal mood and affect. His behavior is normal. Judgment and thought content normal.  Nursing note and vitals reviewed.  BP 99/65 (BP Location: Left Arm)   Pulse (!) 56   Temp 97.6 F (36.4 C) (Oral)   Ht '5\' 10"'  (1.778 m)   Wt 227 lb (103 kg)   BMI 32.57 kg/m         Assessment & Plan:  1. Pure hypercholesterolemia -Continue with aggressive therapeutic lifestyle changes and current treatment - CBC with Differential/Platelet - BMP8+EGFR - Hepatic function panel - NMR, lipoprofile  2. Vitamin D deficiency -Continue with current treatment pending results of lab work - CBC with Differential/Platelet - VITAMIN D 25 Hydroxy (Vit-D Deficiency, Fractures)  3. Gastroesophageal reflux disease, esophagitis presence not specified -The patient is doing well taking 40 mg of Pepcid before meals daily - CBC with Differential/Platelet - Hepatic function panel  4. Benign prostatic hyperplasia, unspecified whether lower urinary tract symptoms present -The patient only has occasional nocturia. He will continue with his Cialis 5 mg as he is doing. - CBC with Differential/Platelet  Patient Instructions  Continue current medications. Continue good therapeutic lifestyle changes which include good diet and exercise. Fall precautions discussed with patient. If an FOBT was given today- please return it to our front desk. If you are over 28 years old - you may need Prevnar 60 or the adult Pneumonia vaccine.  **Flu shots are available--- please call and schedule a FLU-CLINIC appointment**  After your visit with [place] today you will receive a survey in the mail or online from [employer] regarding your care with [place]. Please take a moment to fill this out. Your feedback is very important to [place] as you can help [place] better understand your patient needs as well as improve your  experience and satisfaction. WE CARE ABOUT YOU!!!   The patient will continue with current treatment He will try to do a better job of watching his diet more closely and losing some of the weight he is put on the summer He will continue to follow his diet closely He will continue to monitor blood pressures periodically at work He should make sure that he gets his flu shot and if he does he will let us know about that date    Arrie Senate MD

## 2016-05-31 NOTE — Patient Instructions (Addendum)
Continue current medications. Continue good therapeutic lifestyle changes which include good diet and exercise. Fall precautions discussed with patient. If an FOBT was given today- please return it to our front desk. If you are over 62 years old - you may need Prevnar 66 or the adult Pneumonia vaccine.  **Flu shots are available--- please call and schedule a FLU-CLINIC appointment**  After your visit with [place] today you will receive a survey in the mail or online from [employer] regarding your care with [place]. Please take a moment to fill this out. Your feedback is very important to [place] as you can help [place] better understand your patient needs as well as improve your experience and satisfaction. WE CARE ABOUT YOU!!!   The patient will continue with current treatment He will try to do a better job of watching his diet more closely and losing some of the weight he is put on the summer He will continue to follow his diet closely He will continue to monitor blood pressures periodically at work He should make sure that he gets his flu shot and if he does he will let us know about that date

## 2016-06-01 LAB — CBC WITH DIFFERENTIAL/PLATELET
Basophils Absolute: 0 10*3/uL (ref 0.0–0.2)
Basos: 1 %
EOS (ABSOLUTE): 0.1 10*3/uL (ref 0.0–0.4)
Eos: 2 %
Hematocrit: 42.5 % (ref 37.5–51.0)
Hemoglobin: 15 g/dL (ref 12.6–17.7)
Immature Grans (Abs): 0 10*3/uL (ref 0.0–0.1)
Immature Granulocytes: 1 %
Lymphocytes Absolute: 2.1 10*3/uL (ref 0.7–3.1)
Lymphs: 38 %
MCH: 31.1 pg (ref 26.6–33.0)
MCHC: 35.3 g/dL (ref 31.5–35.7)
MCV: 88 fL (ref 79–97)
Monocytes Absolute: 0.5 10*3/uL (ref 0.1–0.9)
Monocytes: 10 %
Neutrophils Absolute: 2.7 10*3/uL (ref 1.4–7.0)
Neutrophils: 48 %
Platelets: 217 10*3/uL (ref 150–379)
RBC: 4.82 x10E6/uL (ref 4.14–5.80)
RDW: 13.3 % (ref 12.3–15.4)
WBC: 5.5 10*3/uL (ref 3.4–10.8)

## 2016-06-01 LAB — BMP8+EGFR
BUN/Creatinine Ratio: 13 (ref 10–24)
BUN: 13 mg/dL (ref 8–27)
CO2: 20 mmol/L (ref 18–29)
Calcium: 9.3 mg/dL (ref 8.6–10.2)
Chloride: 103 mmol/L (ref 96–106)
Creatinine, Ser: 1.01 mg/dL (ref 0.76–1.27)
GFR calc Af Amer: 92 mL/min/{1.73_m2} (ref >59)
GFR calc non Af Amer: 79 mL/min/{1.73_m2} (ref >59)
Glucose: 110 mg/dL — ABNORMAL HIGH (ref 65–99)
Potassium: 4.4 mmol/L (ref 3.5–5.2)
Sodium: 141 mmol/L (ref 134–144)

## 2016-06-01 LAB — NMR, LIPOPROFILE
Cholesterol: 152 mg/dL (ref 100–199)
HDL Cholesterol by NMR: 45 mg/dL (ref >39)
HDL Particle Number: 33.4 umol/L (ref >=30.5)
LDL Particle Number: 979 nmol/L (ref <1000)
LDL Size: 20.3 nm (ref >20.5)
LDL-C: 65 mg/dL (ref 0–99)
LP-IR Score: 77 — ABNORMAL HIGH (ref <=45)
Small LDL Particle Number: 482 nmol/L (ref <=527)
Triglycerides by NMR: 212 mg/dL — ABNORMAL HIGH (ref 0–149)

## 2016-06-01 LAB — HEPATIC FUNCTION PANEL
ALT: 29 IU/L (ref 0–44)
AST: 19 IU/L (ref 0–40)
Albumin: 4.5 g/dL (ref 3.6–4.8)
Alkaline Phosphatase: 57 IU/L (ref 39–117)
Bilirubin Total: 0.9 mg/dL (ref 0.0–1.2)
Bilirubin, Direct: 0.19 mg/dL (ref 0.00–0.40)
Total Protein: 6.7 g/dL (ref 6.0–8.5)

## 2016-06-01 LAB — VITAMIN D 25 HYDROXY (VIT D DEFICIENCY, FRACTURES): Vit D, 25-Hydroxy: 37.7 ng/mL (ref 30.0–100.0)

## 2016-06-08 ENCOUNTER — Ambulatory Visit (INDEPENDENT_AMBULATORY_CARE_PROVIDER_SITE_OTHER): Payer: BLUE CROSS/BLUE SHIELD

## 2016-06-08 DIAGNOSIS — Z23 Encounter for immunization: Secondary | ICD-10-CM | POA: Diagnosis not present

## 2016-06-10 ENCOUNTER — Encounter: Payer: Self-pay | Admitting: *Deleted

## 2016-06-23 DIAGNOSIS — Z008 Encounter for other general examination: Secondary | ICD-10-CM | POA: Diagnosis not present

## 2016-06-23 DIAGNOSIS — J019 Acute sinusitis, unspecified: Secondary | ICD-10-CM | POA: Diagnosis not present

## 2016-07-07 DIAGNOSIS — I10 Essential (primary) hypertension: Secondary | ICD-10-CM | POA: Diagnosis not present

## 2016-07-07 DIAGNOSIS — R7301 Impaired fasting glucose: Secondary | ICD-10-CM | POA: Diagnosis not present

## 2016-07-07 DIAGNOSIS — E669 Obesity, unspecified: Secondary | ICD-10-CM | POA: Diagnosis not present

## 2016-07-07 DIAGNOSIS — E782 Mixed hyperlipidemia: Secondary | ICD-10-CM | POA: Diagnosis not present

## 2016-07-23 ENCOUNTER — Encounter: Payer: Self-pay | Admitting: Family Medicine

## 2016-07-29 ENCOUNTER — Ambulatory Visit (INDEPENDENT_AMBULATORY_CARE_PROVIDER_SITE_OTHER): Payer: BLUE CROSS/BLUE SHIELD | Admitting: Family Medicine

## 2016-07-29 ENCOUNTER — Encounter: Payer: Self-pay | Admitting: Family Medicine

## 2016-07-29 VITALS — BP 130/85 | HR 76 | Temp 98.2°F | Ht 70.0 in | Wt 230.4 lb

## 2016-07-29 DIAGNOSIS — J0120 Acute ethmoidal sinusitis, unspecified: Secondary | ICD-10-CM | POA: Diagnosis not present

## 2016-07-29 MED ORDER — AZITHROMYCIN 250 MG PO TABS: ORAL_TABLET | ORAL | 0 refills | Status: DC

## 2016-07-29 NOTE — Progress Notes (Signed)
BP 130/85   Pulse 76   Temp 98.2 F (36.8 C) (Oral)   Ht 5\' 10"  (1.778 m)   Wt 230 lb 6 oz (104.5 kg)   BMI 33.06 kg/m    Subjective:    Patient ID: Michael Clark, male    DOB: 10/22/1955, 60 y.o.   MRN: [id number]  HPI: Michael Clark is a 60 y.o. male presenting on 07/29/2016 for Sinus congestion and pressure (taking Mucinex and Flonase, has taken one dose of decongestant, and guaifenesen cough medication); Cough; and Chest congestion   HPI Sinus congestion and pressure Patient comes in today with one-day history of sinus congestion and pressure. He has been using Mucinex and Flonase and a decongestant. He denies any fevers or chills. His sinus pressure is been behind his eyes. He has been having postnasal drainage and cough which is productive. He denies any shortness of breath or wheezing or fevers or chills.  Relevant past medical, surgical, family and social history reviewed and updated as indicated. Interim medical history since our last visit reviewed. Allergies and medications reviewed and updated.  Review of Systems  Constitutional: Negative for chills and fever.  HENT: Positive for congestion, postnasal drip, rhinorrhea, sinus pressure, sneezing and sore throat. Negative for ear discharge, ear pain and voice change.   Eyes: Negative for pain, discharge, redness and visual disturbance.  Respiratory: Positive for cough. Negative for shortness of breath and wheezing.   Cardiovascular: Negative for chest pain and leg swelling.  Musculoskeletal: Negative for gait problem.  Skin: Negative for rash.  All other systems reviewed and are negative.   Per HPI unless specifically indicated above      Objective:    BP 130/85   Pulse 76   Temp 98.2 F (36.8 C) (Oral)   Ht 5\' 10"  (1.778 m)   Wt 230 lb 6 oz (104.5 kg)   BMI 33.06 kg/m   Wt Readings from Last 3 Encounters:  07/29/16 230 lb 6 oz (104.5 kg)  05/31/16 227 lb (103 kg)  11/28/15 221 lb (100.2 kg)      Physical Exam  Constitutional: He is oriented to person, place, and time. He appears well-developed and well-nourished. No distress.  HENT:  Right Ear: Tympanic membrane, external ear and ear canal normal.  Left Ear: Tympanic membrane, external ear and ear canal normal.  Nose: Mucosal edema and rhinorrhea present. No sinus tenderness. No epistaxis. Right sinus exhibits maxillary sinus tenderness. Right sinus exhibits no frontal sinus tenderness. Left sinus exhibits maxillary sinus tenderness. Left sinus exhibits no frontal sinus tenderness.  Mouth/Throat: Uvula is midline and mucous membranes are normal. Posterior oropharyngeal edema and posterior oropharyngeal erythema present. No oropharyngeal exudate or tonsillar abscesses.  Eyes: Conjunctivae are normal. Right eye exhibits no discharge. Left eye exhibits no discharge. No scleral icterus.  Neck: Neck supple. No thyromegaly present.  Cardiovascular: Normal rate, regular rhythm, normal heart sounds and intact distal pulses.   No murmur heard. Pulmonary/Chest: Effort normal and breath sounds normal. No respiratory distress. He has no wheezes. He has no rales.  Musculoskeletal: Normal range of motion. He exhibits no edema.  Lymphadenopathy:    He has no cervical adenopathy.  Neurological: He is alert and oriented to person, place, and time. Coordination normal.  Skin: Skin is warm and dry. No rash noted. He is not diaphoretic.  Psychiatric: He has a normal mood and affect. His behavior is normal.  Nursing note and vitals reviewed.     Assessment &  Plan:   Problem List Items Addressed This Visit    None    Visit Diagnoses    Acute non-recurrent ethmoidal sinusitis    -  Primary   Relevant Medications   azithromycin (ZITHROMAX) 250 MG tablet       Follow up plan: Return if symptoms worsen or fail to improve.  Counseling provided for all of the vaccine components No orders of the defined types were placed in this  encounter.   Caryl Pina, MD [facility] 07/29/2016, 1:24 PM

## 2016-11-25 ENCOUNTER — Other Ambulatory Visit: Payer: BLUE CROSS/BLUE SHIELD

## 2016-11-25 DIAGNOSIS — E7800 Pure hypercholesterolemia, unspecified: Secondary | ICD-10-CM

## 2016-11-25 DIAGNOSIS — K219 Gastro-esophageal reflux disease without esophagitis: Secondary | ICD-10-CM | POA: Diagnosis not present

## 2016-11-25 DIAGNOSIS — E559 Vitamin D deficiency, unspecified: Secondary | ICD-10-CM

## 2016-11-25 DIAGNOSIS — N400 Enlarged prostate without lower urinary tract symptoms: Secondary | ICD-10-CM

## 2016-11-25 DIAGNOSIS — Z0000 Encounter for general adult medical examination without abnormal findings: Secondary | ICD-10-CM | POA: Diagnosis not present

## 2016-11-26 ENCOUNTER — Encounter: Payer: Self-pay | Admitting: Family Medicine

## 2016-11-26 ENCOUNTER — Ambulatory Visit (INDEPENDENT_AMBULATORY_CARE_PROVIDER_SITE_OTHER): Payer: BLUE CROSS/BLUE SHIELD | Admitting: Family Medicine

## 2016-11-26 ENCOUNTER — Other Ambulatory Visit: Payer: Self-pay | Admitting: Family Medicine

## 2016-11-26 ENCOUNTER — Ambulatory Visit (INDEPENDENT_AMBULATORY_CARE_PROVIDER_SITE_OTHER): Payer: BLUE CROSS/BLUE SHIELD

## 2016-11-26 ENCOUNTER — Other Ambulatory Visit: Payer: Self-pay

## 2016-11-26 VITALS — BP 109/67 | HR 72 | Temp 97.0°F | Ht 70.0 in | Wt 230.0 lb

## 2016-11-26 DIAGNOSIS — R350 Frequency of micturition: Secondary | ICD-10-CM

## 2016-11-26 DIAGNOSIS — E785 Hyperlipidemia, unspecified: Secondary | ICD-10-CM

## 2016-11-26 DIAGNOSIS — E559 Vitamin D deficiency, unspecified: Secondary | ICD-10-CM

## 2016-11-26 DIAGNOSIS — E7800 Pure hypercholesterolemia, unspecified: Secondary | ICD-10-CM

## 2016-11-26 DIAGNOSIS — Z0000 Encounter for general adult medical examination without abnormal findings: Secondary | ICD-10-CM | POA: Diagnosis not present

## 2016-11-26 DIAGNOSIS — N400 Enlarged prostate without lower urinary tract symptoms: Secondary | ICD-10-CM

## 2016-11-26 LAB — CBC WITH DIFFERENTIAL/PLATELET
Basophils Absolute: 0 10*3/uL (ref 0.0–0.2)
Basos: 0 %
EOS (ABSOLUTE): 0.1 10*3/uL (ref 0.0–0.4)
Eos: 1 %
Hematocrit: 43 % (ref 37.5–51.0)
Hemoglobin: 15.1 g/dL (ref 13.0–17.7)
Immature Grans (Abs): 0 10*3/uL (ref 0.0–0.1)
Immature Granulocytes: 1 %
Lymphocytes Absolute: 2.9 10*3/uL (ref 0.7–3.1)
Lymphs: 48 %
MCH: 31.1 pg (ref 26.6–33.0)
MCHC: 35.1 g/dL (ref 31.5–35.7)
MCV: 89 fL (ref 79–97)
Monocytes Absolute: 0.4 10*3/uL (ref 0.1–0.9)
Monocytes: 7 %
Neutrophils Absolute: 2.5 10*3/uL (ref 1.4–7.0)
Neutrophils: 43 %
Platelets: 237 10*3/uL (ref 150–379)
RBC: 4.86 x10E6/uL (ref 4.14–5.80)
RDW: 13.1 % (ref 12.3–15.4)
WBC: 5.9 10*3/uL (ref 3.4–10.8)

## 2016-11-26 LAB — BMP8+EGFR
BUN/Creatinine Ratio: 13 (ref 10–24)
BUN: 12 mg/dL (ref 8–27)
CO2: 22 mmol/L (ref 18–29)
Calcium: 9.1 mg/dL (ref 8.6–10.2)
Chloride: 103 mmol/L (ref 96–106)
Creatinine, Ser: 0.93 mg/dL (ref 0.76–1.27)
GFR calc Af Amer: 101 mL/min/{1.73_m2} (ref >59)
GFR calc non Af Amer: 87 mL/min/{1.73_m2} (ref >59)
Glucose: 107 mg/dL — ABNORMAL HIGH (ref 65–99)
Potassium: 4.3 mmol/L (ref 3.5–5.2)
Sodium: 142 mmol/L (ref 134–144)

## 2016-11-26 LAB — URINALYSIS, MICROSCOPIC ONLY
Bacteria, UA: NONE SEEN
Epithelial Cells (non renal): NONE SEEN /hpf (ref 0–10)
RBC, UA: NONE SEEN /hpf (ref 0–?)
Renal Epithel, UA: NONE SEEN /hpf

## 2016-11-26 LAB — HEPATIC FUNCTION PANEL
ALT: 34 IU/L (ref 0–44)
AST: 23 IU/L (ref 0–40)
Albumin: 4.3 g/dL (ref 3.6–4.8)
Alkaline Phosphatase: 65 IU/L (ref 39–117)
Bilirubin Total: 0.4 mg/dL (ref 0.0–1.2)
Bilirubin, Direct: 0.14 mg/dL (ref 0.00–0.40)
Total Protein: 6.9 g/dL (ref 6.0–8.5)

## 2016-11-26 LAB — URINALYSIS, COMPLETE
Bilirubin, UA: NEGATIVE
Glucose, UA: NEGATIVE
Ketones, UA: NEGATIVE
Leukocytes, UA: NEGATIVE
Nitrite, UA: NEGATIVE
Protein, UA: NEGATIVE
RBC, UA: NEGATIVE
Specific Gravity, UA: 1.02 (ref 1.005–1.030)
Urobilinogen, Ur: 0.2 mg/dL (ref 0.2–1.0)
pH, UA: 5.5 (ref 5.0–7.5)

## 2016-11-26 LAB — PSA, TOTAL AND FREE
PSA, Free Pct: 26.9 %
PSA, Free: 0.7 ng/mL
Prostate Specific Ag, Serum: 2.6 ng/mL (ref 0.0–4.0)

## 2016-11-26 LAB — MICROSCOPIC EXAMINATION
Bacteria, UA: NONE SEEN
Epithelial Cells (non renal): NONE SEEN /hpf (ref 0–10)
RBC, UA: NONE SEEN /hpf (ref 0–?)
Renal Epithel, UA: NONE SEEN /hpf
WBC, UA: NONE SEEN /hpf (ref 0–?)

## 2016-11-26 LAB — NMR, LIPOPROFILE
Cholesterol: 159 mg/dL (ref 100–199)
HDL Cholesterol by NMR: 40 mg/dL (ref >39)
HDL Particle Number: 32.3 umol/L (ref >=30.5)
LDL Particle Number: 1257 nmol/L — ABNORMAL HIGH (ref <1000)
LDL Size: 19.9 nm (ref >20.5)
LDL-C: 58 mg/dL (ref 0–99)
LP-IR Score: 73 — ABNORMAL HIGH (ref <=45)
Small LDL Particle Number: 777 nmol/L — ABNORMAL HIGH (ref <=527)
Triglycerides by NMR: 305 mg/dL — ABNORMAL HIGH (ref 0–149)

## 2016-11-26 LAB — VITAMIN D 25 HYDROXY (VIT D DEFICIENCY, FRACTURES): Vit D, 25-Hydroxy: 27.1 ng/mL — ABNORMAL LOW (ref 30.0–100.0)

## 2016-11-26 MED ORDER — SULFAMETHOXAZOLE-TRIMETHOPRIM 800-160 MG PO TABS: 1.0000 | ORAL_TABLET | Freq: Two times a day (BID) | ORAL | 0 refills | Status: DC

## 2016-11-26 MED ORDER — VITAMIN D (ERGOCALCIFEROL) 1.25 MG (50000 UNIT) PO CAPS: 50000.0000 [IU] | ORAL_CAPSULE | ORAL | 1 refills | Status: DC

## 2016-11-26 NOTE — Addendum Note (Signed)
Addended by: Thana Ates on: 11/26/2016 01:57 PM   Modules accepted: Orders

## 2016-11-26 NOTE — Progress Notes (Signed)
Subjective:    Patient ID: Michael Clark, male    DOB: 1956-04-08, 61 y.o.   MRN: [id number]  HPI Patient is here today for a complete physical.Patient also has a place on his shoulder and stomach that he would liked looked at. He also states that he is having some frequent urination. Patient has had blood work done today and this was reviewed with him during the visit. His advanced lipid panel is still pending. All of his kidney function test including his creatinine were good. The blood sugar was slightly elevated as it has been in the past. The CBC was within normal limits. The platelet count was adequate the hemoglobin was stable. All liver function tests were normal. The PSA is within normal limits but elevated by 1. and of course I informed the patient that we don't want to see a rise of any more than 0.75. The last PSA 1 year ago was 1.6 this time it is 2.6. He pointed out that he did have a PSA at work in November and it was 1.7. The vitamin D level is low and he indicates that he has not been taking his vitamin D3 regularly. With a slight PSA elevation compared to a year ago and the urinary frequency, we will check a urine today after doing the prostate exam and ask him to come back again in about 4 weeks for repeating the PSA.Is important to know today that his mother and his maternal grandmother died of dementia and Alzheimer's. His father had prostate cancer in his early 65s. His brother who is older than the patient is alive and well and in good health. Patient denies any chest pain pressure tightness or shortness of breath. He denies any trouble with heartburn if he eats properly and takes his Pepcid regularly. He denies any problems with nausea vomiting diarrhea. He does have occasional blood in the stool which comes from hemorrhoids. He did have a colonoscopy about a year ago which was entirely normal and there is no family history of colon cancer. He is passing his water well other than the  frequency and urgency. He says his stream is always been slow and this is been going on for years.   Review of Systems  Constitutional: Negative.   HENT: Negative.   Eyes: Negative.   Respiratory: Negative.   Cardiovascular: Negative.   Gastrointestinal: Negative.   Endocrine: Negative.   Genitourinary: Positive for frequency.  Musculoskeletal: Negative.   Skin:       Skin tag or mole on right shoulder and abdomen.   Allergic/Immunologic: Negative.   Neurological: Negative.   Hematological: Negative.   Psychiatric/Behavioral: Negative.      Patient Active Problem List   Diagnosis Date Noted  . Erectile dysfunction 05/29/2015  . GERD (gastroesophageal reflux disease) 10/17/2013  . BPH (benign prostatic hyperplasia) 10/17/2013  . Vitamin D deficiency 10/17/2013  . Hyperlipidemia 10/17/2013   Outpatient Encounter Prescriptions as of 11/26/2016  Medication Sig  . CRESTOR 20 MG tablet Take 1 tablet (20 mg total) by mouth daily.  . famotidine (PEPCID) 40 MG tablet Take 40 mg by mouth daily.  . fluticasone (FLONASE) 50 MCG/ACT nasal spray Place 2 sprays into both nostrils daily. As needed  . naproxen sodium (ANAPROX) 220 MG tablet Take 220 mg by mouth 2 (two) times daily with a meal.    . [DISCONTINUED] azithromycin (ZITHROMAX) 250 MG tablet Take 2 the first day and then one each day after.  . [DISCONTINUED]  cholecalciferol (VITAMIN D) 1000 units tablet Take 1,000 Units by mouth daily.   No facility-administered encounter medications on file as of 11/26/2016.        Objective:   Physical Exam  Constitutional: He is oriented to person, place, and time. He appears well-developed and well-nourished. No distress.  The patient is pleasant and alert  HENT:  Head: Normocephalic and atraumatic.  Right Ear: External ear normal.  Left Ear: External ear normal.  Mouth/Throat: Oropharynx is clear and moist. No oropharyngeal exudate.  Slight nasal congestion bilaterally  Eyes:  Conjunctivae and EOM are normal. Pupils are equal, round, and reactive to light. Right eye exhibits no discharge. Left eye exhibits no discharge. No scleral icterus.  Neck: Normal range of motion. Neck supple. No thyromegaly present.  No bruits thyromegaly or anterior cervical adenopathy  Cardiovascular: Normal rate, regular rhythm, normal heart sounds and intact distal pulses.   No murmur heard. The heart is 60/m with a regular rate and rhythm and good peripheral pulses  Pulmonary/Chest: Effort normal and breath sounds normal. No respiratory distress. He has no wheezes. He has no rales. He exhibits no tenderness.  Clear anteriorly and posteriorly and no axillary adenopathy  Abdominal: Soft. Bowel sounds are normal. He exhibits no mass. There is no tenderness. There is no rebound and no guarding.  No abdominal tenderness masses or bruits thyromegaly or inguinal adenopathy  Genitourinary: Rectum normal and penis normal.  Genitourinary Comments: The prostate is enlarged but soft and smooth. There are no rectal masses. The external genitalia were within normal limits. There are no inguinal hernias palpable.  Musculoskeletal: Normal range of motion. He exhibits no edema.  Lymphadenopathy:    He has no cervical adenopathy.  Neurological: He is alert and oriented to person, place, and time. He has normal reflexes. No cranial nerve deficit.  Skin: Skin is warm and dry. No rash noted. No erythema.  The patient has a seborrheic keratosis on his right superior shoulder area and hair follicle inflammation on the abdomen. He will use Polysporin on the abdomen with good cleansing with soap and water and in the future some time we will freeze the seborrheic keratosis.  Psychiatric: He has a normal mood and affect. His behavior is normal. Judgment and thought content normal.  Nursing note and vitals reviewed.   BP 109/67   Pulse 72   Temp 97 F (36.1 C) (Oral)   Ht 5\' 10"  (1.778 m)   Wt 230 lb (104.3 kg)    BMI 33.00 kg/m        Assessment & Plan:  1. Frequent urination -Initial urinalysis followed by second urinalysis after prostate exam - Urinalysis, Complete - Urine culture  2. Annual physical exam -Chest x-ray today  3. Pure hypercholesterolemia -Continue with aggressive therapeutic lifestyle changes and Crestor. The results of his cholesterol numbers are not back yet.  4. Urinary frequency -Check urinalysis prior to prostate exam and after prostate exam and call patient with results once these results are back  5. Benign prostatic hyperplasia, unspecified whether lower urinary tract symptoms present -Patient is only having urinary frequency and urgency. Is important to note that his rate of rise on the PSA from 1 year ago is 1.0. We will check another PSA in about 4 weeks and we will also check the urinalysis results as soon as it is reported and call the patient with any antibiotics if necessary.  6. Vitamin D deficiency -Vitamin D 50,000 units 1 weekly and recheck vitamin D  levels in 3-4 months  No orders of the defined types were placed in this encounter.  Patient Instructions  The patient should continue to eat healthy and stay active and lose weight if possible through diet and exercise He needs to have a repeat PSA in about 4 weeks Should drink plenty of fluids and stay well hydrated   Arrie Senate MD

## 2016-11-26 NOTE — Patient Instructions (Addendum)
The patient should continue to eat healthy and stay active and lose weight if possible through diet and exercise He needs to have a repeat PSA in about 4 weeks Should drink plenty of fluids and stay well hydrated

## 2016-11-28 LAB — URINE CULTURE: Organism ID, Bacteria: NO GROWTH

## 2016-11-30 ENCOUNTER — Ambulatory Visit: Payer: BLUE CROSS/BLUE SHIELD | Admitting: Family Medicine

## 2016-12-27 ENCOUNTER — Other Ambulatory Visit: Payer: BLUE CROSS/BLUE SHIELD

## 2016-12-27 DIAGNOSIS — N400 Enlarged prostate without lower urinary tract symptoms: Secondary | ICD-10-CM

## 2016-12-27 DIAGNOSIS — R350 Frequency of micturition: Secondary | ICD-10-CM

## 2016-12-27 LAB — URINALYSIS, COMPLETE
Bilirubin, UA: NEGATIVE
Glucose, UA: NEGATIVE
Ketones, UA: NEGATIVE
Leukocytes, UA: NEGATIVE
Nitrite, UA: NEGATIVE
Protein, UA: NEGATIVE
RBC, UA: NEGATIVE
Specific Gravity, UA: 1.025 (ref 1.005–1.030)
Urobilinogen, Ur: 0.2 mg/dL (ref 0.2–1.0)
pH, UA: 5.5 (ref 5.0–7.5)

## 2016-12-27 LAB — MICROSCOPIC EXAMINATION
Bacteria, UA: NONE SEEN
Epithelial Cells (non renal): NONE SEEN /hpf (ref 0–10)
RBC, UA: NONE SEEN /hpf (ref 0–?)
Renal Epithel, UA: NONE SEEN /hpf
WBC, UA: NONE SEEN /hpf (ref 0–?)

## 2016-12-28 LAB — PSA, TOTAL AND FREE
PSA, Free Pct: 20 %
PSA, Free: 0.42 ng/mL
Prostate Specific Ag, Serum: 2.1 ng/mL (ref 0.0–4.0)

## 2017-01-17 DIAGNOSIS — E669 Obesity, unspecified: Secondary | ICD-10-CM | POA: Diagnosis not present

## 2017-01-17 DIAGNOSIS — Z008 Encounter for other general examination: Secondary | ICD-10-CM | POA: Diagnosis not present

## 2017-01-17 DIAGNOSIS — Z6832 Body mass index (BMI) 32.0-32.9, adult: Secondary | ICD-10-CM | POA: Diagnosis not present

## 2017-01-17 DIAGNOSIS — Z719 Counseling, unspecified: Secondary | ICD-10-CM | POA: Diagnosis not present

## 2017-01-17 DIAGNOSIS — R7301 Impaired fasting glucose: Secondary | ICD-10-CM | POA: Diagnosis not present

## 2017-01-17 DIAGNOSIS — E782 Mixed hyperlipidemia: Secondary | ICD-10-CM | POA: Diagnosis not present

## 2017-01-17 DIAGNOSIS — I10 Essential (primary) hypertension: Secondary | ICD-10-CM | POA: Diagnosis not present

## 2017-02-24 DIAGNOSIS — M12241 Villonodular synovitis (pigmented), right hand: Secondary | ICD-10-CM | POA: Diagnosis not present

## 2017-03-01 DIAGNOSIS — M12241 Villonodular synovitis (pigmented), right hand: Secondary | ICD-10-CM | POA: Diagnosis not present

## 2017-03-07 DIAGNOSIS — M12241 Villonodular synovitis (pigmented), right hand: Secondary | ICD-10-CM | POA: Diagnosis not present

## 2017-05-31 ENCOUNTER — Ambulatory Visit: Payer: BLUE CROSS/BLUE SHIELD | Admitting: Family Medicine

## 2017-06-14 ENCOUNTER — Encounter: Payer: Self-pay | Admitting: Family Medicine

## 2017-06-14 ENCOUNTER — Ambulatory Visit (INDEPENDENT_AMBULATORY_CARE_PROVIDER_SITE_OTHER): Payer: BLUE CROSS/BLUE SHIELD | Admitting: Family Medicine

## 2017-06-14 VITALS — BP 122/70 | HR 80 | Temp 98.1°F | Ht 70.0 in | Wt 230.0 lb

## 2017-06-14 DIAGNOSIS — N400 Enlarged prostate without lower urinary tract symptoms: Secondary | ICD-10-CM | POA: Diagnosis not present

## 2017-06-14 DIAGNOSIS — E559 Vitamin D deficiency, unspecified: Secondary | ICD-10-CM

## 2017-06-14 DIAGNOSIS — E7800 Pure hypercholesterolemia, unspecified: Secondary | ICD-10-CM | POA: Diagnosis not present

## 2017-06-14 DIAGNOSIS — K219 Gastro-esophageal reflux disease without esophagitis: Secondary | ICD-10-CM

## 2017-06-14 MED ORDER — VITAMIN D (ERGOCALCIFEROL) 1.25 MG (50000 UNIT) PO CAPS: 50000.0000 [IU] | ORAL_CAPSULE | ORAL | 1 refills | Status: DC

## 2017-06-14 MED ORDER — ESOMEPRAZOLE MAGNESIUM 40 MG PO CPDR: 40.0000 mg | DELAYED_RELEASE_CAPSULE | Freq: Every day | ORAL | 3 refills | Status: DC
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
## 2017-06-14 NOTE — Patient Instructions (Addendum)
Continue current medications. Continue good therapeutic lifestyle changes which include good diet and exercise. Fall precautions discussed with patient. If an FOBT was given today- please return it to our front desk. If you are over 63 years old - you may need Prevnar 26 or the adult Pneumonia vaccine.  **Flu shots are available--- please call and schedule a FLU-CLINIC appointment**  After your visit with [place] today you will receive a survey in the mail or online from [employer] regarding your care with [place]. Please take a moment to fill this out. Your feedback is very important to [place] as you can help [place] better understand your patient needs as well as improve your experience and satisfaction. WE CARE ABOUT YOU!!!   Try the new proton pump inhibitor or Nexium 40 mg 1 daily before breakfast and get back in touch with [place] if this does not work well or if it is too expensive Continue to drink plenty of fluids and stay well-hydrated Eat healthy drink plenty of water Decrease the use of the overhead fan as this increases the risk of infection because of dry mucous membranes Patient will reschedule a visit with his orthopedic surgeon for further evaluation of his recurring right hand pain He should watch his diet more closely and avoid fried foods spicy foods and caffeine and alcohol.

## 2017-06-22 DIAGNOSIS — I10 Essential (primary) hypertension: Secondary | ICD-10-CM | POA: Diagnosis not present

## 2017-06-22 DIAGNOSIS — Z008 Encounter for other general examination: Secondary | ICD-10-CM | POA: Diagnosis not present

## 2017-06-22 DIAGNOSIS — Z719 Counseling, unspecified: Secondary | ICD-10-CM | POA: Diagnosis not present

## 2017-06-22 DIAGNOSIS — Z6832 Body mass index (BMI) 32.0-32.9, adult: Secondary | ICD-10-CM | POA: Diagnosis not present

## 2017-06-22 DIAGNOSIS — E669 Obesity, unspecified: Secondary | ICD-10-CM | POA: Diagnosis not present

## 2017-06-22 DIAGNOSIS — E782 Mixed hyperlipidemia: Secondary | ICD-10-CM | POA: Diagnosis not present

## 2017-06-22 DIAGNOSIS — R7301 Impaired fasting glucose: Secondary | ICD-10-CM | POA: Diagnosis not present

## 2017-06-27 DIAGNOSIS — M12241 Villonodular synovitis (pigmented), right hand: Secondary | ICD-10-CM | POA: Diagnosis not present

## 2017-09-28 ENCOUNTER — Encounter: Payer: Self-pay | Admitting: Family Medicine

## 2017-10-10 DIAGNOSIS — E782 Mixed hyperlipidemia: Secondary | ICD-10-CM | POA: Diagnosis not present

## 2017-10-10 DIAGNOSIS — Z79899 Other long term (current) drug therapy: Secondary | ICD-10-CM | POA: Diagnosis not present

## 2017-10-10 DIAGNOSIS — E559 Vitamin D deficiency, unspecified: Secondary | ICD-10-CM | POA: Diagnosis not present

## 2017-10-10 DIAGNOSIS — R7301 Impaired fasting glucose: Secondary | ICD-10-CM | POA: Diagnosis not present

## 2017-10-10 DIAGNOSIS — Z719 Counseling, unspecified: Secondary | ICD-10-CM | POA: Diagnosis not present

## 2017-10-10 DIAGNOSIS — Z008 Encounter for other general examination: Secondary | ICD-10-CM | POA: Diagnosis not present

## 2017-10-18 ENCOUNTER — Encounter: Payer: Self-pay | Admitting: Family Medicine

## 2017-10-24 DIAGNOSIS — E559 Vitamin D deficiency, unspecified: Secondary | ICD-10-CM | POA: Diagnosis not present

## 2017-10-24 DIAGNOSIS — I10 Essential (primary) hypertension: Secondary | ICD-10-CM | POA: Diagnosis not present

## 2017-10-24 DIAGNOSIS — E782 Mixed hyperlipidemia: Secondary | ICD-10-CM | POA: Diagnosis not present

## 2017-10-24 DIAGNOSIS — R7301 Impaired fasting glucose: Secondary | ICD-10-CM | POA: Diagnosis not present

## 2017-11-01 DIAGNOSIS — H25812 Combined forms of age-related cataract, left eye: Secondary | ICD-10-CM | POA: Diagnosis not present

## 2017-11-01 DIAGNOSIS — H2512 Age-related nuclear cataract, left eye: Secondary | ICD-10-CM | POA: Diagnosis not present

## 2017-11-02 DIAGNOSIS — H2511 Age-related nuclear cataract, right eye: Secondary | ICD-10-CM | POA: Diagnosis not present

## 2017-11-08 DIAGNOSIS — H25811 Combined forms of age-related cataract, right eye: Secondary | ICD-10-CM | POA: Diagnosis not present

## 2017-11-08 DIAGNOSIS — H2511 Age-related nuclear cataract, right eye: Secondary | ICD-10-CM | POA: Diagnosis not present

## 2017-12-06 ENCOUNTER — Encounter: Payer: Self-pay | Admitting: Family Medicine

## 2017-12-13 ENCOUNTER — Ambulatory Visit (INDEPENDENT_AMBULATORY_CARE_PROVIDER_SITE_OTHER): Payer: BLUE CROSS/BLUE SHIELD | Admitting: Family Medicine

## 2017-12-13 ENCOUNTER — Encounter: Payer: Self-pay | Admitting: Family Medicine

## 2017-12-13 VITALS — BP 117/71 | HR 65 | Temp 97.0°F | Ht 70.0 in | Wt 215.0 lb

## 2017-12-13 DIAGNOSIS — E559 Vitamin D deficiency, unspecified: Secondary | ICD-10-CM | POA: Diagnosis not present

## 2017-12-13 DIAGNOSIS — Z1283 Encounter for screening for malignant neoplasm of skin: Secondary | ICD-10-CM

## 2017-12-13 DIAGNOSIS — N400 Enlarged prostate without lower urinary tract symptoms: Secondary | ICD-10-CM

## 2017-12-13 DIAGNOSIS — M12249 Villonodular synovitis (pigmented), unspecified hand: Secondary | ICD-10-CM | POA: Insufficient documentation

## 2017-12-13 DIAGNOSIS — E7800 Pure hypercholesterolemia, unspecified: Secondary | ICD-10-CM | POA: Diagnosis not present

## 2017-12-13 DIAGNOSIS — K219 Gastro-esophageal reflux disease without esophagitis: Secondary | ICD-10-CM

## 2017-12-13 DIAGNOSIS — M12241 Villonodular synovitis (pigmented), right hand: Secondary | ICD-10-CM | POA: Diagnosis not present

## 2017-12-13 DIAGNOSIS — Z0000 Encounter for general adult medical examination without abnormal findings: Secondary | ICD-10-CM | POA: Diagnosis not present

## 2017-12-13 LAB — URINALYSIS, COMPLETE
Bilirubin, UA: NEGATIVE
Glucose, UA: NEGATIVE
Leukocytes, UA: NEGATIVE
Nitrite, UA: NEGATIVE
Specific Gravity, UA: 1.025 (ref 1.005–1.030)
Urobilinogen, Ur: 0.2 mg/dL (ref 0.2–1.0)
pH, UA: 5 (ref 5.0–7.5)

## 2017-12-13 LAB — MICROSCOPIC EXAMINATION
Bacteria, UA: NONE SEEN
Epithelial Cells (non renal): NONE SEEN /hpf (ref 0–10)
Renal Epithel, UA: NONE SEEN /hpf
WBC, UA: NONE SEEN /hpf (ref 0–5)

## 2017-12-13 NOTE — Progress Notes (Signed)
Subjective:    Patient ID: Michael Clark, male    DOB: 06-20-1956, 62 y.o.   MRN: [id number]  HPI Patient is here today for annual wellness exam and follow up of chronic medical problems which includes hyperlipidemia. He is taking medication regularly.  The patient is doing well overall.  He has no complaints other than some skin lesions on his head.  He has been working aggressively with his diet and has lost 15 pounds of weight.  He has stopped his Crestor.  He will get lab work today and will be given an FOBT to return.  He did have a colonoscopy in 2017 and this was entirely normal.  The patient is doing well overall.  He has had cataract surgery and lens implants in both eyes in April and he is in a study and is being followed every 3 to 6 months and follow-up for this.  He denies any chest pain pressure tightness or shortness of breath.  He denies any trouble with his stomach and as long as he is taking Protonix he has no problems with heartburn or reflux.  He only takes the naproxen sodium rarely.  He does understand the side effects long-term proton pump inhibitors and will try to change out gradually to an H2 blocker.  He denies any blood in the stool black tarry bowel movements nausea vomiting or diarrhea or change in bowel habits.  He is passing his water without problems.     Patient Active Problem List   Diagnosis Date Noted  . Erectile dysfunction 05/29/2015  . GERD (gastroesophageal reflux disease) 10/17/2013  . BPH (benign prostatic hyperplasia) 10/17/2013  . Vitamin D deficiency 10/17/2013  . Hyperlipidemia 10/17/2013   Outpatient Encounter Medications as of 12/13/2017  Medication Sig  . cholecalciferol (VITAMIN D) 1000 units tablet Take 1,000 Units by mouth daily.  . fluticasone (FLONASE) 50 MCG/ACT nasal spray Place 2 sprays into both nostrils daily. As needed  . Multiple Vitamin (MULTIVITAMIN WITH MINERALS) TABS tablet Take 1 tablet by mouth daily.  . pantoprazole  (PROTONIX) 40 MG tablet Take 40 mg by mouth daily.  . naproxen sodium (ANAPROX) 220 MG tablet Take 220 mg by mouth 2 (two) times daily with a meal.    . [DISCONTINUED] CRESTOR 20 MG tablet Take 1 tablet (20 mg total) by mouth daily.  . [DISCONTINUED] esomeprazole (NEXIUM) 40 MG capsule Take 1 capsule (40 mg total) daily by mouth.  . [DISCONTINUED] famotidine (PEPCID) 40 MG tablet Take 40 mg by mouth daily.  . [DISCONTINUED] Vitamin D, Ergocalciferol, (DRISDOL) 50000 units CAPS capsule Take 1 capsule (50,000 Units total) every 7 (seven) days by mouth.   No facility-administered encounter medications on file as of 12/13/2017.      Review of Systems  Constitutional: Negative.   HENT: Negative.   Eyes: Negative.   Respiratory: Negative.   Cardiovascular: Negative.   Gastrointestinal: Negative.   Endocrine: Negative.   Genitourinary: Negative.   Musculoskeletal: Negative.   Skin: Negative.        Skin lesions on head  Allergic/Immunologic: Negative.   Neurological: Negative.   Hematological: Negative.   Psychiatric/Behavioral: Negative.        Objective:   Physical Exam  Constitutional: He is oriented to person, place, and time. He appears well-developed and well-nourished.  Patient is pleasant and alert and is working aggressively on weight loss with diet and exercise  HENT:  Head: Normocephalic and atraumatic.  Right Ear: External ear normal.  Left  Ear: External ear normal.  Mouth/Throat: Oropharynx is clear and moist. No oropharyngeal exudate.  Nasal turbinate congestion bilaterally  Eyes: Pupils are equal, round, and reactive to light. Conjunctivae and EOM are normal. Right eye exhibits no discharge. Left eye exhibits no discharge. No scleral icterus.  Neck: Normal range of motion. Neck supple. No thyromegaly present.  No bruits thyromegaly or anterior cervical adenopathy  Cardiovascular: Normal rate, regular rhythm, normal heart sounds and intact distal pulses.  No murmur  heard. Heart has a regular rate and rhythm at 72/min with good pedal pulses  Pulmonary/Chest: Effort normal and breath sounds normal. He has no wheezes. He has no rales. He exhibits no tenderness.  Clear anteriorly and posteriorly and no axillary adenopathy or chest wall masses  Abdominal: Soft. Bowel sounds are normal. He exhibits no mass. There is no tenderness. There is no guarding.  No abdominal tenderness masses organ enlargement or bruits and no inguinal adenopathy  Genitourinary: Rectum normal and penis normal.  Genitourinary Comments: Prostate remains slightly enlarged without lumps or masses.  There were no rectal masses.  There were no inguinal hernias palpable and the external genitalia had normal testicles without lumps or masses.  Musculoskeletal: Normal range of motion. He exhibits no edema.  Lymphadenopathy:    He has no cervical adenopathy.  Neurological: He is alert and oriented to person, place, and time. He has normal reflexes. No cranial nerve deficit.  Skin: Skin is warm and dry. No rash noted.  Several enlarging keratoses that appear to be like seborrheic keratoses on the scalp and a few actinic keratoses on the face.  We will make arrangements for him to see the dermatologist for further evaluation and thorough skin check that he should get done once yearly.  Psychiatric: He has a normal mood and affect. His behavior is normal. Judgment and thought content normal.  Is pleasant and relaxed  Nursing note and vitals reviewed.   BP 117/71 (BP Location: Left Arm)   Pulse 65   Temp (!) 97 F (36.1 C) (Oral)   Ht _0  (1.778 m)   Wt 215 lb (97.5 kg)   BMI 30.85 kg/m   EKG with results pending===     Assessment & Plan:  1. Annual physical exam -The patient is up-to-date on his health maintenance issues.  He will get an EKG today.  He will get a stress test every 3 or 4 years as deemed appropriate by his primary care physician at that time.  We will schedule him for a  good skin check and management of any future skin problems with a dermatologist. - Thyroid Panel With TSH - PSA, total and free - Lipid panel - BMP8+EGFR - CBC with Differential/Platelet - VITAMIN D 25 Hydroxy (Vit-D Deficiency, Fractures) - Hepatic function panel - Urinalysis, Complete - EKG 12-Lead  2. Pure hypercholesterolemia -The patient has stopped his Crestor as a result of losing 15 pounds of weight.  We will see if this needs to be restarted with the lab work. - Lipid panel - BMP8+EGFR - CBC with Differential/Platelet - EKG 12-Lead  3. Benign prostatic hyperplasia, unspecified whether lower urinary tract symptoms present -Prostate remains slightly enlarged with no urinary tract symptoms.  His erectile dysfunction is good presently. - PSA, total and free - CBC with Differential/Platelet - Urinalysis, Complete  4. Vitamin D deficiency -Tinea current treatment pending results of lab work - CBC with Differential/Platelet - VITAMIN D 25 Hydroxy (Vit-D Deficiency, Fractures)  5. Gastroesophageal reflux disease,  esophagitis presence not specified -There is a family history of Alzheimer's disease.  Both in his mother and maternal grandmother.  He will try an H2 blocker twice daily on Monday Wednesday and Friday before breakfast and supper and stay on the proton pump inhibitor or Protonix on all the other days and do this for the next 3 to 6 months.  He understands if he has breakthrough reflux that he can go back on the proton pump inhibitor at any time. - CBC with Differential/Platelet - Hepatic function panel  Patient Instructions  Continue current medications. Continue good therapeutic lifestyle changes which include good diet and exercise. Fall precautions discussed with patient. If an FOBT was given today- please return it to our front desk. If you are over 92 years old - you may need Prevnar 66 or the adult Pneumonia vaccine.  **Flu shots are available--- please call  and schedule a FLU-CLINIC appointment**  After your visit with [place] today you will receive a survey in the mail or online from [employer] regarding your care with [place]. Please take a moment to fill this out. Your feedback is very important to [place] as you can help [place] better understand your patient needs as well as improve your experience and satisfaction. WE CARE ABOUT YOU!!!   You can purchase ranitidine or Zantac 150 mg over the counter, the equate brand at [facility] for a very low cost.  Over the next several months she may want to try gradually weaning off the Protonix if possible I would start with a ranitidine 150 mg twice daily before breakfast and supper on Monday Wednesday and Friday and do the Protonix all other days and probably would continue to do that for 6 months and after 6 months we could gradually reduce the Protonix some more and insert the ranitidine instead.  You must continue to watch her diet as far as fried foods alcohol and anti-inflammatory medicines. You should get an exercise treadmill test every 3 to 4 years. We will call with lab work results as soon as these results become available Schedule you for a visit with [facility] dermatology.  Arrie Senate MD

## 2017-12-13 NOTE — Patient Instructions (Addendum)
Continue current medications. Continue good therapeutic lifestyle changes which include good diet and exercise. Fall precautions discussed with patient. If an FOBT was given today- please return it to our front desk. If you are over 64 years old - you may need Prevnar 101 or the adult Pneumonia vaccine.  **Flu shots are available--- please call and schedule a FLU-CLINIC appointment**  After your visit with [place] today you will receive a survey in the mail or online from [employer] regarding your care with [place]. Please take a moment to fill this out. Your feedback is very important to [place] as you can help [place] better understand your patient needs as well as improve your experience and satisfaction. WE CARE ABOUT YOU!!!   You can purchase ranitidine or Zantac 150 mg over the counter, the equate brand at [facility] for a very low cost.  Over the next several months she may want to try gradually weaning off the Protonix if possible I would start with a ranitidine 150 mg twice daily before breakfast and supper on Monday Wednesday and Friday and do the Protonix all other days and probably would continue to do that for 6 months and after 6 months we could gradually reduce the Protonix some more and insert the ranitidine instead.  You must continue to watch her diet as far as fried foods alcohol and anti-inflammatory medicines. You should get an exercise treadmill test every 3 to 4 years. We will call with lab work results as soon as these results become available Schedule you for a visit with [facility] dermatology.

## 2017-12-14 LAB — BMP8+EGFR
BUN/Creatinine Ratio: 9 — ABNORMAL LOW (ref 10–24)
BUN: 9 mg/dL (ref 8–27)
CO2: 20 mmol/L (ref 20–29)
Calcium: 9.1 mg/dL (ref 8.6–10.2)
Chloride: 106 mmol/L (ref 96–106)
Creatinine, Ser: 0.98 mg/dL (ref 0.76–1.27)
GFR calc Af Amer: 94 mL/min/{1.73_m2} (ref >59)
GFR calc non Af Amer: 81 mL/min/{1.73_m2} (ref >59)
Glucose: 99 mg/dL (ref 65–99)
Potassium: 3.9 mmol/L (ref 3.5–5.2)
Sodium: 140 mmol/L (ref 134–144)

## 2017-12-14 LAB — HEPATIC FUNCTION PANEL
ALT: 28 IU/L (ref 0–44)
AST: 23 IU/L (ref 0–40)
Albumin: 4.3 g/dL (ref 3.6–4.8)
Alkaline Phosphatase: 74 IU/L (ref 39–117)
Bilirubin Total: 0.5 mg/dL (ref 0.0–1.2)
Bilirubin, Direct: 0.14 mg/dL (ref 0.00–0.40)
Total Protein: 6.2 g/dL (ref 6.0–8.5)

## 2017-12-14 LAB — PSA, TOTAL AND FREE
PSA, Free Pct: 24.8 %
PSA, Free: 0.57 ng/mL
Prostate Specific Ag, Serum: 2.3 ng/mL (ref 0.0–4.0)

## 2017-12-14 LAB — CBC WITH DIFFERENTIAL/PLATELET
Basophils Absolute: 0 10*3/uL (ref 0.0–0.2)
Basos: 0 %
EOS (ABSOLUTE): 0 10*3/uL (ref 0.0–0.4)
Eos: 1 %
Hematocrit: 44.2 % (ref 37.5–51.0)
Hemoglobin: 15.2 g/dL (ref 13.0–17.7)
Immature Grans (Abs): 0 10*3/uL (ref 0.0–0.1)
Immature Granulocytes: 0 %
Lymphocytes Absolute: 1.4 10*3/uL (ref 0.7–3.1)
Lymphs: 38 %
MCH: 31.1 pg (ref 26.6–33.0)
MCHC: 34.4 g/dL (ref 31.5–35.7)
MCV: 90 fL (ref 79–97)
Monocytes Absolute: 0.4 10*3/uL (ref 0.1–0.9)
Monocytes: 10 %
Neutrophils Absolute: 1.9 10*3/uL (ref 1.4–7.0)
Neutrophils: 51 %
Platelets: 194 10*3/uL (ref 150–379)
RBC: 4.89 x10E6/uL (ref 4.14–5.80)
RDW: 13.7 % (ref 12.3–15.4)
WBC: 3.8 10*3/uL (ref 3.4–10.8)

## 2017-12-14 LAB — THYROID PANEL WITH TSH
Free Thyroxine Index: 2 (ref 1.2–4.9)
T3 Uptake Ratio: 28 % (ref 24–39)
T4, Total: 7.1 ug/dL (ref 4.5–12.0)
TSH: 3.26 u[IU]/mL (ref 0.450–4.500)

## 2017-12-14 LAB — VITAMIN D 25 HYDROXY (VIT D DEFICIENCY, FRACTURES): Vit D, 25-Hydroxy: 43.3 ng/mL (ref 30.0–100.0)

## 2017-12-14 LAB — LIPID PANEL
Chol/HDL Ratio: 4.3 ratio (ref 0.0–5.0)
Cholesterol, Total: 141 mg/dL (ref 100–199)
HDL: 33 mg/dL — ABNORMAL LOW (ref >39)
LDL Calculated: 79 mg/dL (ref 0–99)
Triglycerides: 143 mg/dL (ref 0–149)
VLDL Cholesterol Cal: 29 mg/dL (ref 5–40)

## 2018-02-08 ENCOUNTER — Other Ambulatory Visit: Payer: BLUE CROSS/BLUE SHIELD

## 2018-02-08 DIAGNOSIS — R350 Frequency of micturition: Secondary | ICD-10-CM | POA: Diagnosis not present

## 2018-02-08 LAB — URINALYSIS, COMPLETE
Bilirubin, UA: NEGATIVE
Glucose, UA: NEGATIVE
Ketones, UA: NEGATIVE
Leukocytes, UA: NEGATIVE
Nitrite, UA: NEGATIVE
Protein, UA: NEGATIVE
Specific Gravity, UA: 1.025 (ref 1.005–1.030)
Urobilinogen, Ur: 0.2 mg/dL (ref 0.2–1.0)
pH, UA: 6 (ref 5.0–7.5)

## 2018-02-08 LAB — MICROSCOPIC EXAMINATION
Bacteria, UA: NONE SEEN
Renal Epithel, UA: NONE SEEN /hpf

## 2018-02-09 LAB — URINE CULTURE

## 2018-03-28 DIAGNOSIS — M12241 Villonodular synovitis (pigmented), right hand: Secondary | ICD-10-CM | POA: Diagnosis not present

## 2018-04-28 ENCOUNTER — Encounter: Payer: Self-pay | Admitting: Family Medicine

## 2018-04-28 ENCOUNTER — Ambulatory Visit: Payer: BLUE CROSS/BLUE SHIELD | Admitting: Family Medicine

## 2018-04-28 VITALS — BP 128/81 | HR 61 | Temp 97.3°F | Ht 70.0 in | Wt 203.0 lb

## 2018-04-28 DIAGNOSIS — R05 Cough: Secondary | ICD-10-CM | POA: Diagnosis not present

## 2018-04-28 DIAGNOSIS — J209 Acute bronchitis, unspecified: Secondary | ICD-10-CM

## 2018-04-28 DIAGNOSIS — J208 Acute bronchitis due to other specified organisms: Secondary | ICD-10-CM

## 2018-04-28 DIAGNOSIS — R059 Cough, unspecified: Secondary | ICD-10-CM

## 2018-04-28 MED ORDER — GUAIFENESIN-CODEINE 100-10 MG/5ML PO SOLN: 10.0000 mL | Freq: Three times a day (TID) | ORAL | 0 refills | Status: AC | PRN

## 2018-04-28 MED ORDER — ALBUTEROL SULFATE HFA 108 (90 BASE) MCG/ACT IN AERS: 2.0000 | INHALATION_SPRAY | Freq: Four times a day (QID) | RESPIRATORY_TRACT | 0 refills | Status: DC | PRN

## 2018-04-28 MED ORDER — AZITHROMYCIN 250 MG PO TABS: ORAL_TABLET | ORAL | 0 refills | Status: DC

## 2018-04-28 NOTE — Patient Instructions (Signed)

## 2018-04-28 NOTE — Progress Notes (Signed)
Subjective:    Patient ID: Michael Clark, male    DOB: [birth year], 62 y.o.   MRN: [id number]  Chief Complaint:  Cough   HPI: Michael Clark is a 62 y.o. male presenting on 04/28/2018 for Cough  Pt presents today with ongoing cough for 3 weeks. Pt states the cough has become worse over the last few days. He has had fever and chills with the cough. The cough is productive, thick and yellowish in color per pt. He reports the cough is worse at night and is interfering with his sleep. Pt states he had taken mucinex and robitussin without relief of symptoms. He denies shortness of breath, chest pain, or palpitations.   1. Acute bronchitis due to infection   2. Cough      Relevant past medical, surgical, family and social history reviewed and updated as indicated. Interim medical history since our last visit reviewed. Allergies and medications reviewed and updated. DATA REVIEWED: CHART IN EPIC  Family History reviewed for pertinent findings.  Past Medical History:  Diagnosis Date  . BPH (benign prostatic hyperplasia)   . GERD (gastroesophageal reflux disease)   . Hyperlipidemia   . Trigger finger, left   . Vitamin D deficiency     Past Surgical History:  Procedure Laterality Date  . APPENDECTOMY    . CARPAL TUNNEL RELEASE  2006   LEFT   . EYE SURGERY Bilateral    cataracts  . LEFT THUM PULLEY RELEASE  2006--  DONE W/ CARPAL TUNNEL RELEASE  . TRIGGER FINGER RELEASE  07/16/2011   Procedure: RELEASE TRIGGER FINGER/A-1 PULLEY;  Surgeon: [name];  Location: [facility];  Service: Orthopedics;  Laterality: Left;    Social History   Socioeconomic History  . Marital status: Married    Spouse name: Not on file  . Number of children: Not on file  . Years of education: Not on file  . Highest education level: Not on file  Occupational History  . Not on file  Social Needs  . Financial resource strain: Not on file  . Food insecurity:    Worry: Not on  file    Inability: Not on file  . Transportation needs:    Medical: Not on file    Non-medical: Not on file  Tobacco Use  . Smoking status: Former Smoker    Last attempt to quit: 10/17/2004    Years since quitting: 13.5  . Smokeless tobacco: Former Network engineer and Sexual Activity  . Alcohol use: Yes    Alcohol/week: 10.0 standard drinks    Types: 10 Cans of beer per week  . Drug use: No  . Sexual activity: Not on file  Lifestyle  . Physical activity:    Days per week: Not on file    Minutes per session: Not on file  . Stress: Not on file  Relationships  . Social connections:    Talks on phone: Not on file    Gets together: Not on file    Attends religious service: Not on file    Active member of club or organization: Not on file    Attends meetings of clubs or organizations: Not on file    Relationship status: Not on file  . Intimate partner violence:    Fear of current or ex partner: Not on file    Emotionally abused: Not on file    Physically abused: Not on file    Forced sexual activity: Not  on file  Other Topics Concern  . Not on file  Social History Narrative  . Not on file    Outpatient Encounter Medications as of 04/28/2018  Medication Sig  . albuterol (VENTOLIN HFA) 108 (90 Base) MCG/ACT inhaler Inhale 2 puffs into the lungs every 6 (six) hours as needed for up to 5 days for wheezing or shortness of breath (cough).  Marland Kitchen azithromycin (ZITHROMAX Z-PAK) 250 MG tablet As directed  . cholecalciferol (VITAMIN D) 1000 units tablet Take 1,000 Units by mouth daily.  . fluticasone (FLONASE) 50 MCG/ACT nasal spray Place 2 sprays into both nostrils daily. As needed  . guaiFENesin-codeine 100-10 MG/5ML syrup Take 10 mLs by mouth 3 (three) times daily as needed for up to 5 days for cough.  . Multiple Vitamin (MULTIVITAMIN WITH MINERALS) TABS tablet Take 1 tablet by mouth daily.  . naproxen sodium (ANAPROX) 220 MG tablet Take 220 mg by mouth 2 (two) times daily with a meal.     . [DISCONTINUED] pantoprazole (PROTONIX) 40 MG tablet Take 40 mg by mouth daily.   No facility-administered encounter medications on file as of 04/28/2018.     No Known Allergies  Review of Systems  Constitutional: Positive for chills, fatigue and fever.  HENT: Positive for congestion. Negative for ear discharge, ear pain, postnasal drip, rhinorrhea, sinus pressure, sinus pain, sneezing and sore throat.   Respiratory: Positive for cough. Negative for chest tightness and shortness of breath.   Cardiovascular: Negative for chest pain and palpitations.  Neurological: Negative for dizziness, light-headedness and headaches.  All other systems reviewed and are negative.       Objective:    BP 128/81   Pulse 61   Temp (!) 97.3 F (36.3 C) (Oral)   Ht 5\' 10"  (1.778 m)   Wt 203 lb (92.1 kg)   SpO2 97%   BMI 29.13 kg/m    Wt Readings from Last 3 Encounters:  04/28/18 203 lb (92.1 kg)  12/13/17 215 lb (97.5 kg)  06/14/17 230 lb (104.3 kg)    Physical Exam  Constitutional: He is oriented to person, place, and time. He appears well-developed and well-nourished. No distress.  HENT:  Head: Normocephalic.  Right Ear: Hearing, tympanic membrane, external ear and ear canal normal.  Left Ear: Hearing, tympanic membrane, external ear and ear canal normal.  Nose: Nose normal.  Mouth/Throat: Uvula is midline, oropharynx is clear and moist and mucous membranes are normal. No oropharyngeal exudate, posterior oropharyngeal edema or posterior oropharyngeal erythema.  Eyes: Pupils are equal, round, and reactive to light. Conjunctivae are normal.  Neck: Normal range of motion. Neck supple. No JVD present. No tracheal deviation present. No thyromegaly present.  Cardiovascular: Normal rate, regular rhythm and normal heart sounds.  Pulmonary/Chest: Effort normal. No respiratory distress. He has wheezes (diffuse expiratory). He has rhonchi in the right upper field and the left upper field.    Lymphadenopathy:    He has no cervical adenopathy.  Neurological: He is alert and oriented to person, place, and time.  Skin: Skin is warm and dry. Capillary refill takes less than 2 seconds.  Psychiatric: He has a normal mood and affect. His behavior is normal. Judgment and thought content normal.  Nursing note and vitals reviewed.       Assessment & Plan:   1. Cough Increase water intake, add humidity to the air. Cough drops or throat lozenges. Hot tea with honey. Albuterol as needed for cough and wheezing. Guaifenesin with codeine as needed at  night for cough, sedation precautions.  - albuterol (VENTOLIN HFA) 108 (90 Base) MCG/ACT inhaler; Inhale 2 puffs into the lungs every 6 (six) hours as needed for up to 5 days for wheezing or shortness of breath (cough).  Dispense: 1 Inhaler; Refill: 0 - guaiFENesin-codeine 100-10 MG/5ML syrup; Take 10 mLs by mouth 3 (three) times daily as needed for up to 5 days for cough.  Dispense: 120 mL; Refill: 0  2. Acute bronchitis due to infection Medications as prescribed. Increase water intake, add humidity to the air. - azithromycin (ZITHROMAX Z-PAK) 250 MG tablet; As directed  Dispense: 6 tablet; Refill: 0 - albuterol (VENTOLIN HFA) 108 (90 Base) MCG/ACT inhaler; Inhale 2 puffs into the lungs every 6 (six) hours as needed for up to 5 days for wheezing or shortness of breath (cough).  Dispense: 1 Inhaler; Refill: 0 - guaiFENesin-codeine 100-10 MG/5ML syrup; Take 10 mLs by mouth 3 (three) times daily as needed for up to 5 days for cough.  Dispense: 120 mL; Refill: 0   Continue all other maintenance medications.  Follow up plan: Return if symptoms worsen or fail to improve.  Educational handout given for bronchitis  The above assessment and management plan was discussed with the patient. The patient verbalized understanding of and has agreed to the management plan. Patient is aware to call the clinic if symptoms persist or worsen. Patient is aware  when to return to the clinic for a follow-up visit. Patient educated on when it is appropriate to go to the emergency department.   Monia Pouch, FNP-C [facility] [phone]

## 2018-05-29 DIAGNOSIS — E782 Mixed hyperlipidemia: Secondary | ICD-10-CM | POA: Diagnosis not present

## 2018-05-29 DIAGNOSIS — Z6828 Body mass index (BMI) 28.0-28.9, adult: Secondary | ICD-10-CM | POA: Diagnosis not present

## 2018-05-29 DIAGNOSIS — R7301 Impaired fasting glucose: Secondary | ICD-10-CM | POA: Diagnosis not present

## 2018-05-29 DIAGNOSIS — Z008 Encounter for other general examination: Secondary | ICD-10-CM | POA: Diagnosis not present

## 2018-05-31 ENCOUNTER — Encounter: Payer: Self-pay | Admitting: Family Medicine

## 2018-05-31 ENCOUNTER — Other Ambulatory Visit: Payer: Self-pay | Admitting: *Deleted

## 2018-05-31 DIAGNOSIS — K219 Gastro-esophageal reflux disease without esophagitis: Secondary | ICD-10-CM

## 2018-05-31 DIAGNOSIS — E559 Vitamin D deficiency, unspecified: Secondary | ICD-10-CM

## 2018-05-31 DIAGNOSIS — Z0000 Encounter for general adult medical examination without abnormal findings: Secondary | ICD-10-CM

## 2018-05-31 DIAGNOSIS — N400 Enlarged prostate without lower urinary tract symptoms: Secondary | ICD-10-CM

## 2018-05-31 DIAGNOSIS — E7800 Pure hypercholesterolemia, unspecified: Secondary | ICD-10-CM

## 2018-06-22 ENCOUNTER — Ambulatory Visit: Payer: BLUE CROSS/BLUE SHIELD | Admitting: Family Medicine

## 2018-06-22 ENCOUNTER — Encounter: Payer: Self-pay | Admitting: Family Medicine

## 2018-06-22 VITALS — BP 109/66 | HR 54 | Temp 97.6°F | Ht 70.0 in | Wt 201.0 lb

## 2018-06-22 DIAGNOSIS — E559 Vitamin D deficiency, unspecified: Secondary | ICD-10-CM

## 2018-06-22 DIAGNOSIS — J209 Acute bronchitis, unspecified: Secondary | ICD-10-CM

## 2018-06-22 DIAGNOSIS — E7800 Pure hypercholesterolemia, unspecified: Secondary | ICD-10-CM

## 2018-06-22 DIAGNOSIS — K219 Gastro-esophageal reflux disease without esophagitis: Secondary | ICD-10-CM | POA: Diagnosis not present

## 2018-06-22 DIAGNOSIS — J019 Acute sinusitis, unspecified: Secondary | ICD-10-CM

## 2018-06-22 DIAGNOSIS — B9689 Other specified bacterial agents as the cause of diseases classified elsewhere: Secondary | ICD-10-CM

## 2018-06-22 DIAGNOSIS — J208 Acute bronchitis due to other specified organisms: Secondary | ICD-10-CM

## 2018-06-22 MED ORDER — DOXYCYCLINE HYCLATE 100 MG PO TABS: 100.0000 mg | ORAL_TABLET | Freq: Two times a day (BID) | ORAL | 0 refills | Status: DC

## 2018-06-22 MED ORDER — ALBUTEROL SULFATE HFA 108 (90 BASE) MCG/ACT IN AERS: 2.0000 | INHALATION_SPRAY | Freq: Four times a day (QID) | RESPIRATORY_TRACT | 0 refills | Status: DC | PRN

## 2018-06-22 NOTE — Patient Instructions (Addendum)
Continue current medications. Continue good therapeutic lifestyle changes which include good diet and exercise. Fall precautions discussed with patient. If an FOBT was given today- please return it to our front desk. If you are over 64 years old - you may need Prevnar 31 or the adult Pneumonia vaccine.  **Flu shots are available--- please call and schedule a FLU-CLINIC appointment**  After your visit with [place] today you will receive a survey in the mail or online from [employer] regarding your care with [place]. Please take a moment to fill this out. Your feedback is very important to [place] as you can help [place] better understand your patient needs as well as improve your experience and satisfaction. WE CARE ABOUT YOU!!!   Mucinex, maximum strength, blue and white in color, plain, 1 twice daily with a large glass of water Use nasal saline frequently 1 spray each nostril at least 4 times daily Drink plenty of water and fluids Take Tylenol for aches pains and fever Take doxycycline twice daily with food until completed Use albuterol inhaler 1 puff every 4-6 hours as needed for wheezing and chest tightness Get flu shot in about 1 week

## 2018-06-22 NOTE — Addendum Note (Signed)
Addended by: Zannie Cove on: 06/22/2018 08:35 AM   Modules accepted: Orders

## 2018-06-22 NOTE — Progress Notes (Signed)
 Subjective:    Patient ID: Michael Clark, male    DOB: 02/08/1956, 62 y.o.   MRN: [id number]  HPI Pt here for follow up and management of chronic medical problems which includes hyperlipidemia. He is taking medication regularly.  The patient comes in today for his regular 6-month follow-up and is once again sick with congestion and head and chest.  He took a Z-Pak previously and this helped him but he is sick again and he is not been able to get his flu shot yet.  He is due to return in FOBT and will get lab work today he is also due to get a flu shot but will most likely hold off on this until next week.  The patient today denies any chest pain or shortness of breath.  He denies any headache or fever.  The drainage is clear and green at times.  He denies any GI symptoms or urinary tract symptoms.     Patient Active Problem List   Diagnosis Date Noted  . Erectile dysfunction 05/29/2015  . GERD (gastroesophageal reflux disease) 10/17/2013  . BPH (benign prostatic hyperplasia) 10/17/2013  . Vitamin D deficiency 10/17/2013  . Hyperlipidemia 10/17/2013   Outpatient Encounter Medications as of 06/22/2018  Medication Sig  . cholecalciferol (VITAMIN D) 1000 units tablet Take 1,000 Units by mouth daily.  . fluticasone (FLONASE) 50 MCG/ACT nasal spray Place 2 sprays into both nostrils daily. As needed  . Multiple Vitamin (MULTIVITAMIN WITH MINERALS) TABS tablet Take 1 tablet by mouth daily.  . [DISCONTINUED] albuterol (VENTOLIN HFA) 108 (90 Base) MCG/ACT inhaler Inhale 2 puffs into the lungs every 6 (six) hours as needed for up to 5 days for wheezing or shortness of breath (cough).  . [DISCONTINUED] azithromycin (ZITHROMAX Z-PAK) 250 MG tablet As directed  . [DISCONTINUED] naproxen sodium (ANAPROX) 220 MG tablet Take 220 mg by mouth 2 (two) times daily with a meal.     No facility-administered encounter medications on file as of 06/22/2018.      Review of Systems  Constitutional: Negative.    HENT: Positive for congestion (chest and head ).   Eyes: Negative.   Respiratory: Negative.   Cardiovascular: Negative.   Gastrointestinal: Negative.   Endocrine: Negative.   Genitourinary: Negative.   Musculoskeletal: Negative.   Skin: Negative.   Allergic/Immunologic: Negative.   Neurological: Negative.   Hematological: Negative.   Psychiatric/Behavioral: Negative.        Objective:   Physical Exam  Constitutional: He is oriented to person, place, and time. He appears well-developed and well-nourished. No distress.  Pleasant and alert and feeling sick for at least a week.  HENT:  Head: Normocephalic and atraumatic.  Right Ear: External ear normal.  Left Ear: External ear normal.  Mouth/Throat: Oropharynx is clear and moist. No oropharyngeal exudate.  Sinus tenderness  Eyes: Pupils are equal, round, and reactive to light. Conjunctivae and EOM are normal. Right eye exhibits no discharge. Left eye exhibits no discharge. No scleral icterus.  Neck: Normal range of motion. Neck supple. No thyromegaly present.  No anterior cervical adenopathy thyromegaly or bruits  Cardiovascular: Normal rate, regular rhythm and normal heart sounds.  No murmur heard. Heart is regular at 60/min with no edema or murmur  Pulmonary/Chest: Effort normal and breath sounds normal. No respiratory distress. He has no wheezes. He has no rales. He exhibits no tenderness.  No rales or rhonchi only bronchial irritation with coughing.  No axillary adenopathy  Abdominal: Soft. Bowel sounds are   normal. He exhibits no mass. There is no tenderness.  Musculoskeletal: Normal range of motion. He exhibits no edema.  Lymphadenopathy:    He has no cervical adenopathy.  Neurological: He is alert and oriented to person, place, and time. He has normal reflexes. No cranial nerve deficit.  Skin: Skin is warm and dry. No rash noted.  Psychiatric: He has a normal mood and affect. His behavior is normal. Judgment and thought  content normal.  The patient's mood affect and behavior are all normal.  Nursing note and vitals reviewed.   BP 109/66 (BP Location: Left Arm)   Pulse (!) 54   Temp 97.6 F (36.4 C) (Oral)   Ht 5' 10" (1.778 m)   Wt 201 lb (91.2 kg)   BMI 28.84 kg/m         Assessment & Plan:  1. Vitamin D deficiency -10 you with vitamin D replacement pending results of lab work - CBC with Differential/Platelet - VITAMIN D 25 Hydroxy (Vit-D Deficiency, Fractures)  2. Pure hypercholesterolemia -Continue with cholesterol treatment pending results of lab work - BMP8+EGFR - CBC with Differential/Platelet - Lipid panel  3. Gastroesophageal reflux disease, esophagitis presence not specified -No tenderness and patient says this is much better since he is lost some weight. - CBC with Differential/Platelet - Hepatic function panel  4. Acute bronchitis due to infection -Doxycycline 100 twice daily with food -Mucinex maximum strength, plain 1 twice daily with a large glass of water -Albuterol inhaler every 4-6 hours as needed for wheezing  5. Acute bacterial rhinosinusitis -Doxycycline 100 twice daily with food and use nasal saline frequently during the day continue with Flonase.  No orders of the defined types were placed in this encounter.  Patient Instructions  Continue current medications. Continue good therapeutic lifestyle changes which include good diet and exercise. Fall precautions discussed with patient. If an FOBT was given today- please return it to our front desk. If you are over 50 years old - you may need Prevnar 13 or the adult Pneumonia vaccine.  **Flu shots are available--- please call and schedule a FLU-CLINIC appointment**  After your visit with us today you will receive a survey in the mail or online from [employer] regarding your care with us. Please take a moment to fill this out. Your feedback is very important to us as you can help us better understand your patient  needs as well as improve your experience and satisfaction. WE CARE ABOUT YOU!!!   Mucinex, maximum strength, blue and white in color, plain, 1 twice daily with a large glass of water Use nasal saline frequently 1 spray each nostril at least 4 times daily Drink plenty of water and fluids Take Tylenol for aches pains and fever Take doxycycline twice daily with food until completed Use albuterol inhaler 1 puff every 4-6 hours as needed for wheezing and chest tightness Get flu shot in about 1 week  Don W. Moore MD   

## 2018-06-23 LAB — CBC WITH DIFFERENTIAL/PLATELET
Basophils Absolute: 0 10*3/uL (ref 0.0–0.2)
Basos: 0 %
EOS (ABSOLUTE): 0.1 10*3/uL (ref 0.0–0.4)
Eos: 1 %
Hematocrit: 45.7 % (ref 37.5–51.0)
Hemoglobin: 16.5 g/dL (ref 13.0–17.7)
Immature Grans (Abs): 0 10*3/uL (ref 0.0–0.1)
Immature Granulocytes: 0 %
Lymphocytes Absolute: 2.7 10*3/uL (ref 0.7–3.1)
Lymphs: 38 %
MCH: 31.5 pg (ref 26.6–33.0)
MCHC: 36.1 g/dL — ABNORMAL HIGH (ref 31.5–35.7)
MCV: 87 fL (ref 79–97)
Monocytes Absolute: 0.7 10*3/uL (ref 0.1–0.9)
Monocytes: 9 %
Neutrophils Absolute: 3.7 10*3/uL (ref 1.4–7.0)
Neutrophils: 52 %
Platelets: 211 10*3/uL (ref 150–450)
RBC: 5.24 x10E6/uL (ref 4.14–5.80)
RDW: 12.4 % (ref 12.3–15.4)
WBC: 7.2 10*3/uL (ref 3.4–10.8)

## 2018-06-23 LAB — BMP8+EGFR
BUN/Creatinine Ratio: 11 (ref 10–24)
BUN: 10 mg/dL (ref 8–27)
CO2: 20 mmol/L (ref 20–29)
Calcium: 9.4 mg/dL (ref 8.6–10.2)
Chloride: 103 mmol/L (ref 96–106)
Creatinine, Ser: 0.91 mg/dL (ref 0.76–1.27)
GFR calc Af Amer: 103 mL/min/{1.73_m2} (ref >59)
GFR calc non Af Amer: 89 mL/min/{1.73_m2} (ref >59)
Glucose: 104 mg/dL — ABNORMAL HIGH (ref 65–99)
Potassium: 4.3 mmol/L (ref 3.5–5.2)
Sodium: 138 mmol/L (ref 134–144)

## 2018-06-23 LAB — HEPATIC FUNCTION PANEL
ALT: 22 IU/L (ref 0–44)
AST: 23 IU/L (ref 0–40)
Albumin: 4.6 g/dL (ref 3.6–4.8)
Alkaline Phosphatase: 74 IU/L (ref 39–117)
Bilirubin Total: 0.8 mg/dL (ref 0.0–1.2)
Bilirubin, Direct: 0.18 mg/dL (ref 0.00–0.40)
Total Protein: 6.7 g/dL (ref 6.0–8.5)

## 2018-06-23 LAB — LIPID PANEL
Chol/HDL Ratio: 4.3 ratio (ref 0.0–5.0)
Cholesterol, Total: 176 mg/dL (ref 100–199)
HDL: 41 mg/dL (ref >39)
LDL Calculated: 102 mg/dL — ABNORMAL HIGH (ref 0–99)
Triglycerides: 165 mg/dL — ABNORMAL HIGH (ref 0–149)
VLDL Cholesterol Cal: 33 mg/dL (ref 5–40)

## 2018-06-23 LAB — VITAMIN D 25 HYDROXY (VIT D DEFICIENCY, FRACTURES): Vit D, 25-Hydroxy: 36.7 ng/mL (ref 30.0–100.0)

## 2018-06-29 ENCOUNTER — Telehealth: Payer: Self-pay

## 2018-06-29 NOTE — Telephone Encounter (Signed)
[facility] PCP  Insurance denied prior auth for Albuterol HFA    NOt a covered benefit

## 2018-06-30 NOTE — Telephone Encounter (Signed)
Denial said not a covered benefit for his plan so I suppose he needs to pay for it

## 2018-06-30 NOTE — Telephone Encounter (Signed)
What is covered as far as rescue inhaler

## 2018-06-30 NOTE — Telephone Encounter (Signed)
Patient aware and states he does not need it at this time.

## 2018-06-30 NOTE — Telephone Encounter (Signed)
Let patient know insurance will not cover reesscue inhaler and he may have to pay cash for it.

## 2018-07-11 ENCOUNTER — Ambulatory Visit (INDEPENDENT_AMBULATORY_CARE_PROVIDER_SITE_OTHER): Payer: BLUE CROSS/BLUE SHIELD | Admitting: *Deleted

## 2018-07-11 DIAGNOSIS — Z23 Encounter for immunization: Secondary | ICD-10-CM

## 2018-07-12 DIAGNOSIS — I10 Essential (primary) hypertension: Secondary | ICD-10-CM | POA: Diagnosis not present

## 2018-07-12 DIAGNOSIS — E782 Mixed hyperlipidemia: Secondary | ICD-10-CM | POA: Diagnosis not present

## 2018-07-12 DIAGNOSIS — R7301 Impaired fasting glucose: Secondary | ICD-10-CM | POA: Diagnosis not present

## 2018-07-12 DIAGNOSIS — Z008 Encounter for other general examination: Secondary | ICD-10-CM | POA: Diagnosis not present

## 2018-07-27 DIAGNOSIS — L570 Actinic keratosis: Secondary | ICD-10-CM | POA: Diagnosis not present

## 2018-07-27 DIAGNOSIS — L821 Other seborrheic keratosis: Secondary | ICD-10-CM | POA: Diagnosis not present

## 2018-07-27 DIAGNOSIS — D225 Melanocytic nevi of trunk: Secondary | ICD-10-CM | POA: Diagnosis not present

## 2018-07-27 DIAGNOSIS — D2261 Melanocytic nevi of right upper limb, including shoulder: Secondary | ICD-10-CM | POA: Diagnosis not present

## 2018-07-27 DIAGNOSIS — D485 Neoplasm of uncertain behavior of skin: Secondary | ICD-10-CM | POA: Diagnosis not present

## 2018-09-06 DIAGNOSIS — J0100 Acute maxillary sinusitis, unspecified: Secondary | ICD-10-CM | POA: Diagnosis not present

## 2018-09-06 DIAGNOSIS — Z720 Tobacco use: Secondary | ICD-10-CM | POA: Diagnosis not present

## 2018-09-06 DIAGNOSIS — Z716 Tobacco abuse counseling: Secondary | ICD-10-CM | POA: Diagnosis not present

## 2018-09-18 ENCOUNTER — Encounter: Payer: Self-pay | Admitting: Family Medicine

## 2018-09-18 ENCOUNTER — Other Ambulatory Visit: Payer: Self-pay | Admitting: *Deleted

## 2018-09-18 DIAGNOSIS — M545 Low back pain, unspecified: Secondary | ICD-10-CM

## 2018-10-04 DIAGNOSIS — R7301 Impaired fasting glucose: Secondary | ICD-10-CM | POA: Diagnosis not present

## 2018-10-04 DIAGNOSIS — Z008 Encounter for other general examination: Secondary | ICD-10-CM | POA: Diagnosis not present

## 2018-10-04 DIAGNOSIS — E782 Mixed hyperlipidemia: Secondary | ICD-10-CM | POA: Diagnosis not present

## 2018-10-04 DIAGNOSIS — I10 Essential (primary) hypertension: Secondary | ICD-10-CM | POA: Diagnosis not present

## 2018-10-18 DIAGNOSIS — Z720 Tobacco use: Secondary | ICD-10-CM | POA: Diagnosis not present

## 2018-10-18 DIAGNOSIS — Z716 Tobacco abuse counseling: Secondary | ICD-10-CM | POA: Diagnosis not present

## 2018-12-06 IMAGING — DX DG CHEST 2V
2 series · 2 of 2 positions shown · non-contrast
Comparison: 10/23/2014

CLINICAL DATA: hyperlipidemia

EXAM:
CHEST  2 VIEW

[chest pa]
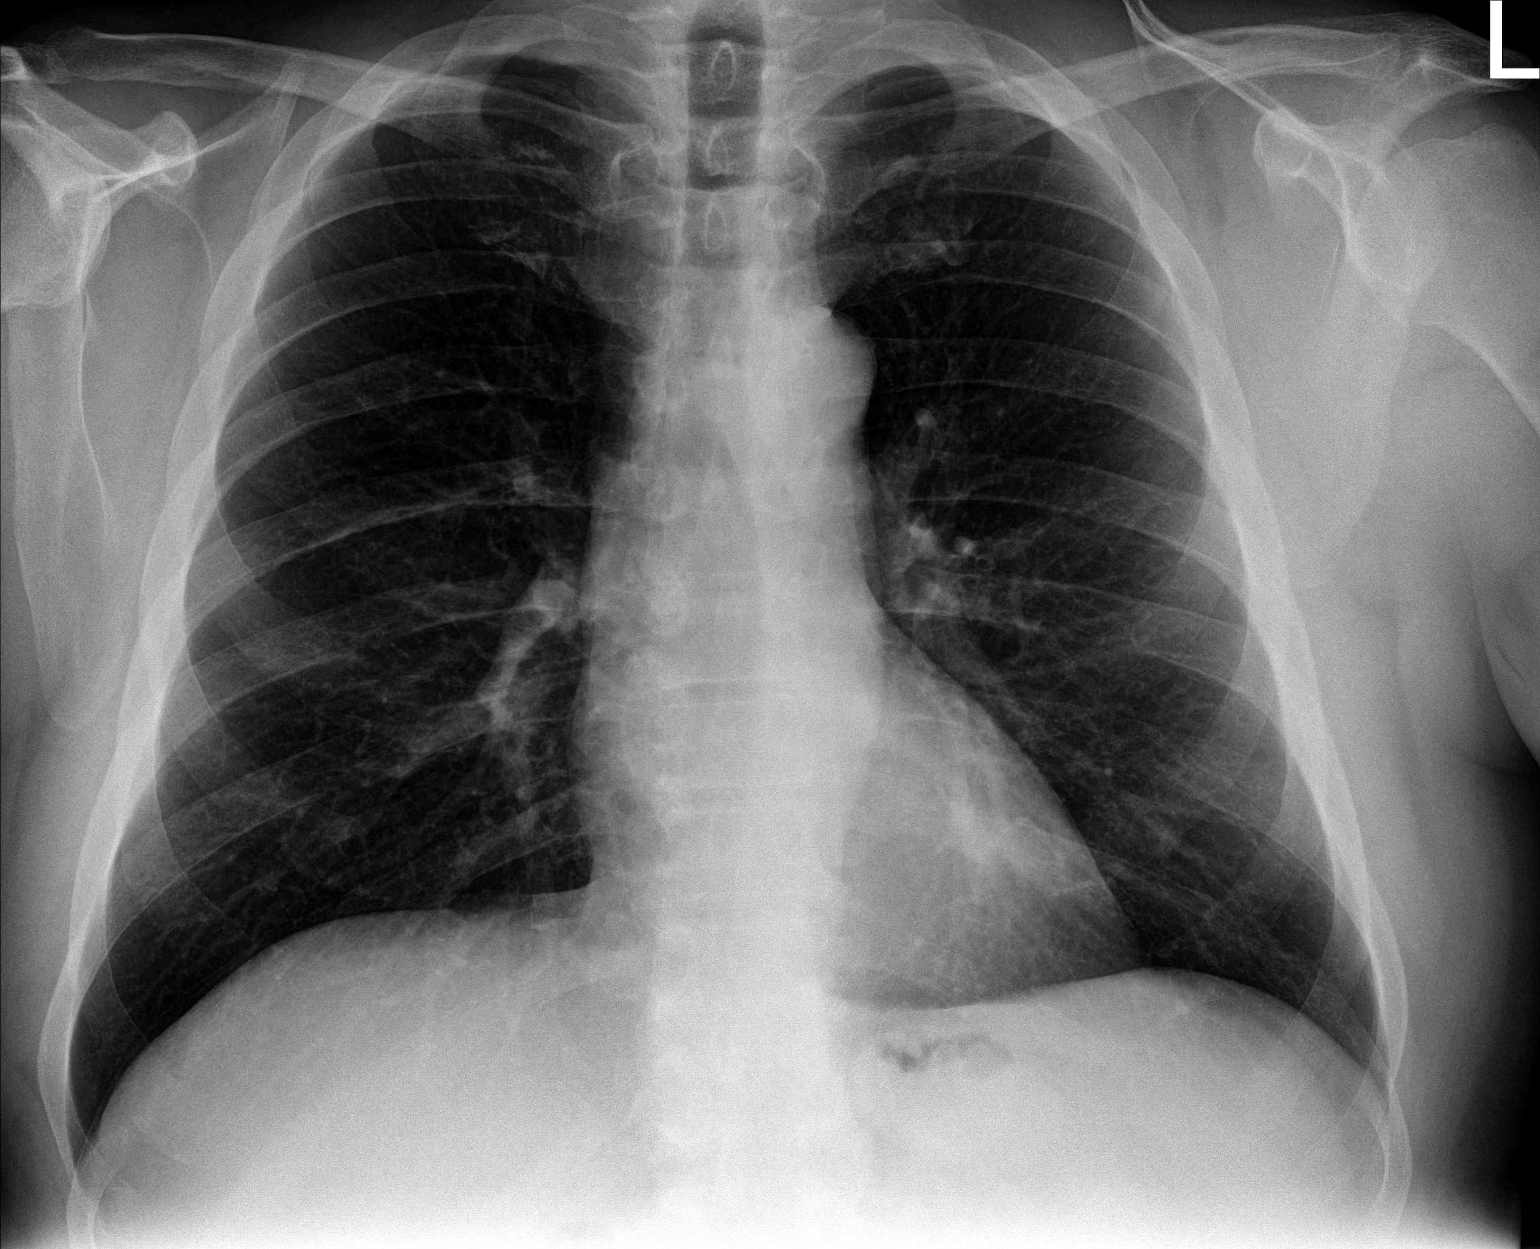

[chest lat]
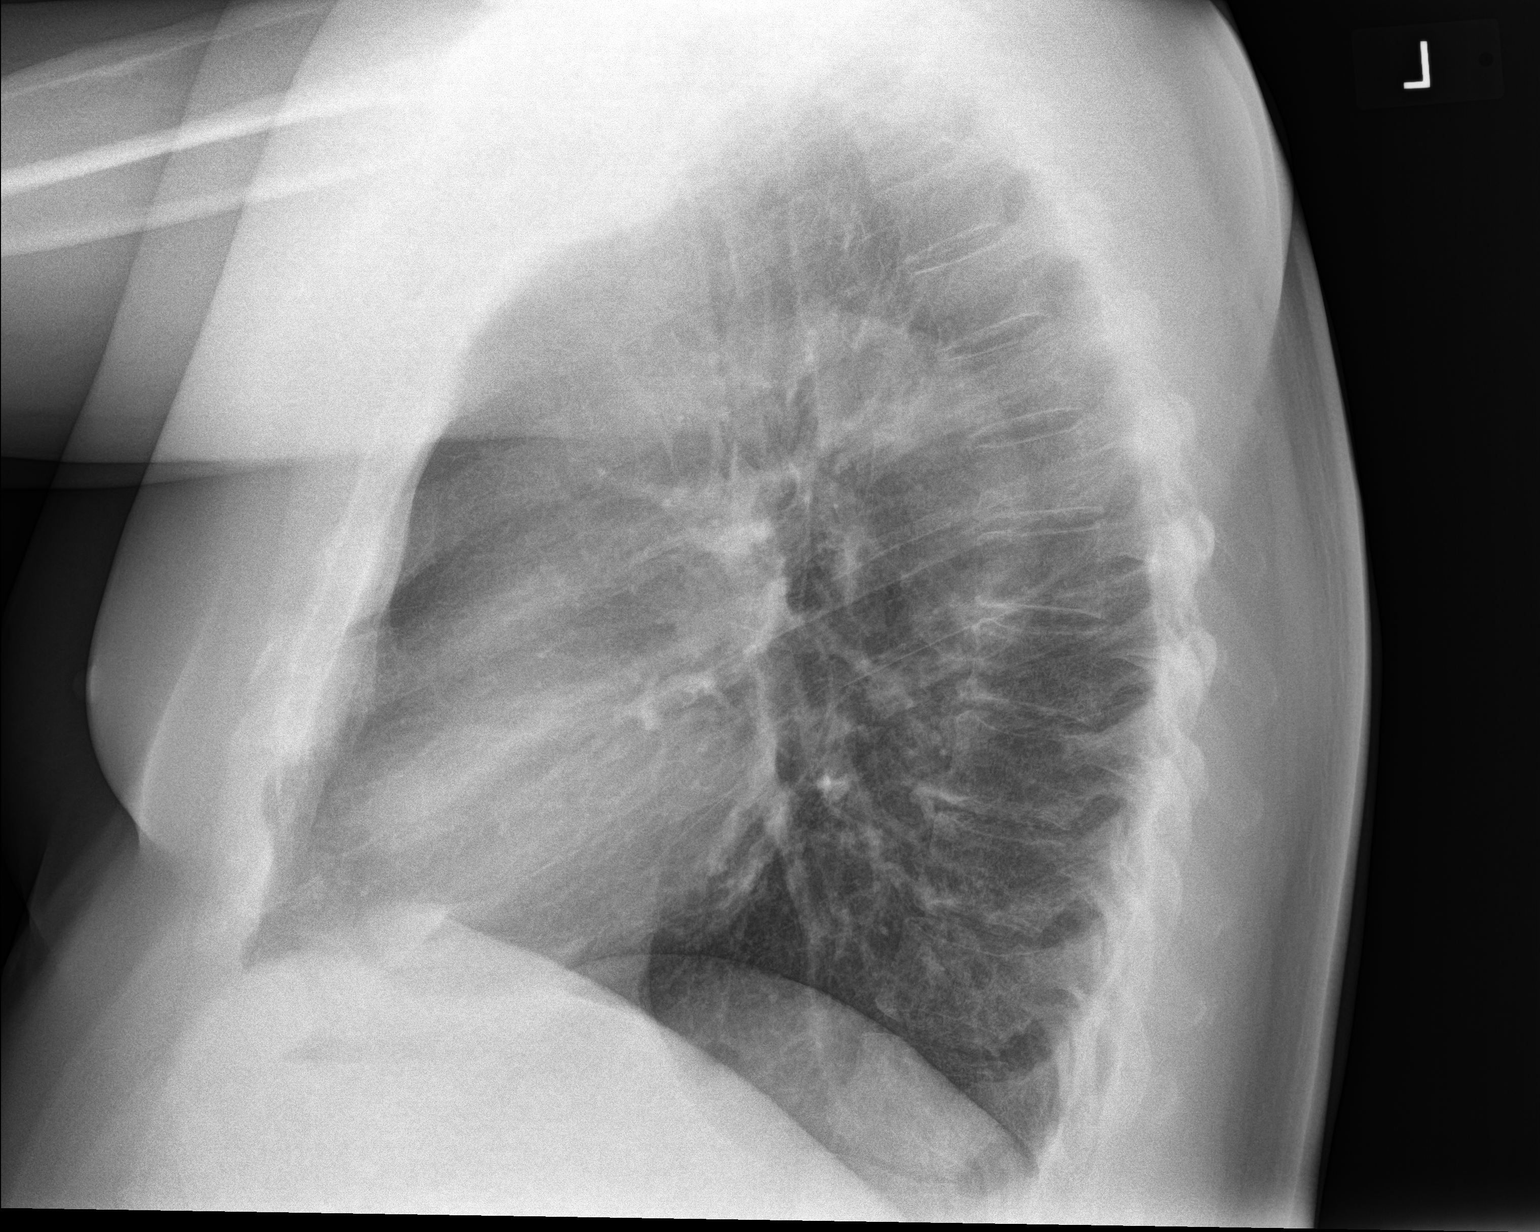

[2 of 2 positions shown; findings below may reference images not displayed]

FINDINGS: The heart size and mediastinal contours are within normal limits.
Both lungs are clear. No pleural effusion or pneumothorax. The
visualized skeletal structures are unremarkable.
IMPRESSION: No active cardiopulmonary disease.

## 2018-12-14 ENCOUNTER — Encounter (HOSPITAL_COMMUNITY): Payer: Self-pay

## 2018-12-14 ENCOUNTER — Emergency Department (HOSPITAL_COMMUNITY)
Admission: EM | Admit: 2018-12-14 | Discharge: 2018-12-14 | Disposition: A | Discharge status: Home / Self Care | Payer: BLUE CROSS/BLUE SHIELD | Attending: Emergency Medicine | Admitting: Emergency Medicine

## 2018-12-14 ENCOUNTER — Emergency Department (HOSPITAL_COMMUNITY): Payer: BLUE CROSS/BLUE SHIELD

## 2018-12-14 ENCOUNTER — Other Ambulatory Visit: Payer: Self-pay

## 2018-12-14 DIAGNOSIS — N132 Hydronephrosis with renal and ureteral calculous obstruction: Secondary | ICD-10-CM | POA: Diagnosis not present

## 2018-12-14 DIAGNOSIS — R1032 Left lower quadrant pain: Secondary | ICD-10-CM | POA: Diagnosis not present

## 2018-12-14 DIAGNOSIS — Z79899 Other long term (current) drug therapy: Secondary | ICD-10-CM | POA: Insufficient documentation

## 2018-12-14 DIAGNOSIS — Z87891 Personal history of nicotine dependence: Secondary | ICD-10-CM | POA: Insufficient documentation

## 2018-12-14 DIAGNOSIS — N201 Calculus of ureter: Secondary | ICD-10-CM | POA: Diagnosis not present

## 2018-12-14 DIAGNOSIS — R112 Nausea with vomiting, unspecified: Secondary | ICD-10-CM | POA: Diagnosis not present

## 2018-12-14 DIAGNOSIS — I700 Atherosclerosis of aorta: Secondary | ICD-10-CM | POA: Diagnosis not present

## 2018-12-14 LAB — COMPREHENSIVE METABOLIC PANEL
ALT: 24 U/L (ref 0–44)
AST: 26 U/L (ref 15–41)
Albumin: 4.2 g/dL (ref 3.5–5.0)
Alkaline Phosphatase: 53 U/L (ref 38–126)
Anion gap: 12 (ref 5–15)
BUN: 14 mg/dL (ref 8–23)
CO2: 21 mmol/L — ABNORMAL LOW (ref 22–32)
Calcium: 9.3 mg/dL (ref 8.9–10.3)
Chloride: 103 mmol/L (ref 98–111)
Creatinine, Ser: 1.09 mg/dL (ref 0.61–1.24)
GFR calc Af Amer: >60 mL/min (ref >60)
GFR calc non Af Amer: >60 mL/min (ref >60)
Glucose, Bld: 152 mg/dL — ABNORMAL HIGH (ref 70–99)
Potassium: 3.8 mmol/L (ref 3.5–5.1)
Sodium: 136 mmol/L (ref 135–145)
Total Bilirubin: 1.3 mg/dL — ABNORMAL HIGH (ref 0.3–1.2)
Total Protein: 7.1 g/dL (ref 6.5–8.1)

## 2018-12-14 LAB — CBC WITH DIFFERENTIAL/PLATELET
Abs Immature Granulocytes: 0.05 10*3/uL (ref 0.00–0.07)
Basophils Absolute: 0 10*3/uL (ref 0.0–0.1)
Basophils Relative: 0 %
Eosinophils Absolute: 0 10*3/uL (ref 0.0–0.5)
Eosinophils Relative: 0 %
HCT: 43.3 % (ref 39.0–52.0)
Hemoglobin: 15.6 g/dL (ref 13.0–17.0)
Immature Granulocytes: 1 %
Lymphocytes Relative: 14 %
Lymphs Abs: 1.4 10*3/uL (ref 0.7–4.0)
MCH: 32.1 pg (ref 26.0–34.0)
MCHC: 36 g/dL (ref 30.0–36.0)
MCV: 89.1 fL (ref 80.0–100.0)
Monocytes Absolute: 0.4 10*3/uL (ref 0.1–1.0)
Monocytes Relative: 5 %
Neutro Abs: 7.8 10*3/uL — ABNORMAL HIGH (ref 1.7–7.7)
Neutrophils Relative %: 80 %
Platelets: 230 10*3/uL (ref 150–400)
RBC: 4.86 MIL/uL (ref 4.22–5.81)
RDW: 11.8 % (ref 11.5–15.5)
WBC: 9.7 10*3/uL (ref 4.0–10.5)
nRBC: 0 % (ref 0.0–0.2)

## 2018-12-14 LAB — LACTIC ACID, PLASMA
Lactic Acid, Venous: 2.3 mmol/L (ref 0.5–1.9)
Lactic Acid, Venous: 1.8 mmol/L (ref 0.5–1.9)

## 2018-12-14 LAB — URINALYSIS, ROUTINE W REFLEX MICROSCOPIC
Bilirubin Urine: NEGATIVE
Glucose, UA: NEGATIVE mg/dL
Hgb urine dipstick: NEGATIVE
Ketones, ur: 5 mg/dL — AB
Leukocytes,Ua: NEGATIVE
Nitrite: NEGATIVE
Protein, ur: NEGATIVE mg/dL
Specific Gravity, Urine: 1.013 (ref 1.005–1.030)
pH: 6 (ref 5.0–8.0)

## 2018-12-14 LAB — LIPASE, BLOOD: Lipase: 27 U/L (ref 11–51)

## 2018-12-14 MED ORDER — HYDROMORPHONE HCL 1 MG/ML IJ SOLN
1.0000 mg | Freq: Once | INTRAMUSCULAR | Status: AC
  Administered 2018-12-14: 07:00:00 1 mg via INTRAVENOUS
  Filled 2018-12-14: qty 1

## 2018-12-14 MED ORDER — ONDANSETRON HCL 4 MG/2ML IJ SOLN
4.0000 mg | Freq: Once | INTRAMUSCULAR | Status: AC
  Administered 2018-12-14: 4 mg via INTRAVENOUS
  Filled 2018-12-14: qty 2

## 2018-12-14 MED ORDER — KETOROLAC TROMETHAMINE 30 MG/ML IJ SOLN
15.0000 mg | Freq: Once | INTRAMUSCULAR | Status: AC
  Administered 2018-12-14: 07:00:00 15 mg via INTRAVENOUS
  Filled 2018-12-14: qty 1

## 2018-12-14 MED ORDER — IBUPROFEN 600 MG PO TABS: 600.0000 mg | ORAL_TABLET | Freq: Four times a day (QID) | ORAL | 0 refills | Status: DC | PRN

## 2018-12-14 MED ORDER — OXYCODONE-ACETAMINOPHEN 5-325 MG PO TABS: 1.0000 | ORAL_TABLET | ORAL | 0 refills | Status: DC | PRN

## 2018-12-14 MED ORDER — ONDANSETRON 4 MG PO TBDP: 4.0000 mg | ORAL_TABLET | Freq: Three times a day (TID) | ORAL | 0 refills | Status: DC | PRN

## 2018-12-14 MED ORDER — MORPHINE SULFATE (PF) 4 MG/ML IV SOLN
4.0000 mg | Freq: Once | INTRAVENOUS | Status: AC
  Administered 2018-12-14: 06:00:00 4 mg via INTRAVENOUS
  Filled 2018-12-14: qty 1

## 2018-12-14 MED ORDER — TAMSULOSIN HCL 0.4 MG PO CAPS: 0.4000 mg | ORAL_CAPSULE | Freq: Every day | ORAL | 0 refills | Status: DC

## 2018-12-14 MED ORDER — SODIUM CHLORIDE 0.9 % IV BOLUS
1000.0000 mL | Freq: Once | INTRAVENOUS | Status: AC
  Administered 2018-12-14: 07:00:00 1000 mL via INTRAVENOUS

## 2018-12-14 MED ORDER — IOHEXOL 300 MG/ML  SOLN
100.0000 mL | Freq: Once | INTRAMUSCULAR | Status: AC | PRN
  Administered 2018-12-14: 06:00:00 100 mL via INTRAVENOUS

## 2018-12-14 MED ORDER — SODIUM CHLORIDE 0.9 % IV BOLUS
1000.0000 mL | Freq: Once | INTRAVENOUS | Status: AC
  Administered 2018-12-14: 06:00:00 1000 mL via INTRAVENOUS

## 2018-12-14 NOTE — ED Triage Notes (Signed)
Pt reports LLQ pain that started a couple of days ago, pt says pain correlated with eating strawberries. Pt vomited on way here. Pt says pain has been intermittent for a couple of days, but this morning became constant and increased.

## 2018-12-14 NOTE — Discharge Instructions (Signed)
Take the pain medication and nausea medication as prescribed.  Your should follow-up with the urologist.  Return to the ED with worsening pain, fever, vomiting, inability to urinate, or any other concerns.

## 2018-12-14 NOTE — ED Provider Notes (Signed)
[facility] EMERGENCY DEPARTMENT Provider Note   CSN: [id number] Arrival date & time: 12/14/18  5176    History   Chief Complaint Chief Complaint  Patient presents with  . Abdominal Pain    HPI Michael Clark is a 63 y.o. male.     Patient reports intermittent left-sided lower abdominal pain for the past several days is been coming and going.  He had a good day yesterday but then woke up around 1 AM with severe left-sided lower abdominal pain which is constant and progressively worsening.  He took some Tylenol at home without relief.  He attempted to make himself throw up and did throw up one time.  He also gave himself a suppository but did not have a bowel movement.  Last bowel movement was yesterday.  Denies any other vomiting.  His bowel movements have been normal.  Denies any fevers, chills, pain with urination or blood in the urine.  He is never had this kind of pain before.  Denies any history of stomach problems.  Previous appendectomy. Denies any testicular pain.  No chest pain or shortness of breath.  The history is provided by the patient.  Abdominal Pain  Associated symptoms: nausea and vomiting   Associated symptoms: no chest pain, no constipation, no cough, no diarrhea, no dysuria, no hematuria and no shortness of breath     Past Medical History:  Diagnosis Date  . BPH (benign prostatic hyperplasia)   . GERD (gastroesophageal reflux disease)   . Hyperlipidemia   . Trigger finger, left   . Vitamin D deficiency     Patient Active Problem List   Diagnosis Date Noted  . Erectile dysfunction 05/29/2015  . GERD (gastroesophageal reflux disease) 10/17/2013  . BPH (benign prostatic hyperplasia) 10/17/2013  . Vitamin D deficiency 10/17/2013  . Hyperlipidemia 10/17/2013    Past Surgical History:  Procedure Laterality Date  . APPENDECTOMY    . CARPAL TUNNEL RELEASE  2006   LEFT   . EYE SURGERY Bilateral    cataracts  . LEFT THUM PULLEY RELEASE  2006--  DONE W/  CARPAL TUNNEL RELEASE  . TRIGGER FINGER RELEASE  07/16/2011   Procedure: RELEASE TRIGGER FINGER/A-1 PULLEY;  Surgeon: [name];  Location: [place];  Service: Orthopedics;  Laterality: Left;        Home Medications    Prior to Admission medications   Medication Sig Start Date End Date Taking? Authorizing Provider  albuterol (PROVENTIL HFA;VENTOLIN HFA) 108 (90 Base) MCG/ACT inhaler Inhale 2 puffs into the lungs every 6 (six) hours as needed for wheezing or shortness of breath. 06/22/18   Chipper Herb, MD  cholecalciferol (VITAMIN D) 1000 units tablet Take 1,000 Units by mouth daily.    [provider]  doxycycline (VIBRA-TABS) 100 MG tablet Take 1 tablet (100 mg total) by mouth 2 (two) times daily. 1 po bid 06/22/18   Chipper Herb, MD  fluticasone [facility]) 50 MCG/ACT nasal spray Place 2 sprays into both nostrils daily. As needed 11/28/15   Chipper Herb, MD  Multiple Vitamin (MULTIVITAMIN WITH MINERALS) TABS tablet Take 1 tablet by mouth daily.    [provider]    Family History Family History  Problem Relation Age of Onset  . Alzheimer's disease Mother   . Cancer Father        prostate  . Diabetes Father     Social History Social History   Tobacco Use  . Smoking status: Former Smoker  Last attempt to quit: 10/17/2004    Years since quitting: 14.1  . Smokeless tobacco: Former Network engineer Use Topics  . Alcohol use: Yes    Alcohol/week: 10.0 standard drinks    Types: 10 Cans of beer per week  . Drug use: No     Allergies   Patient has no known allergies.   Review of Systems Review of Systems  Constitutional: Negative for activity change and appetite change.  HENT: Negative for congestion and rhinorrhea.   Eyes: Negative for visual disturbance.  Respiratory: Negative for cough, chest tightness and shortness of breath.   Cardiovascular: Negative for chest pain.  Gastrointestinal: Positive for abdominal pain,  nausea and vomiting. Negative for constipation and diarrhea.  Genitourinary: Negative for decreased urine volume, dysuria, flank pain, frequency, hematuria, testicular pain and urgency.  Musculoskeletal: Negative for arthralgias and myalgias.  Skin: Negative for rash.  Neurological: Negative for dizziness, weakness and headaches.    all other systems are negative except as noted in the HPI and PMH.    Physical Exam Updated Vital Signs BP (!) 137/96   Pulse 62   Temp 98 F (36.7 C) (Oral)   Resp 17   Ht 5\' 11"  (1.803 m)   Wt 93 kg   SpO2 99%   BMI 28.59 kg/m   Physical Exam Vitals signs and nursing note reviewed.  Constitutional:      General: He is not in acute distress.    Appearance: He is well-developed. He is not ill-appearing.  HENT:     Head: Normocephalic and atraumatic.     Mouth/Throat:     Pharynx: No oropharyngeal exudate.  Eyes:     Conjunctiva/sclera: Conjunctivae normal.     Pupils: Pupils are equal, round, and reactive to light.  Neck:     Musculoskeletal: Normal range of motion and neck supple.     Comments: No meningismus. Cardiovascular:     Rate and Rhythm: Normal rate and regular rhythm.     Heart sounds: Normal heart sounds. No murmur.  Pulmonary:     Effort: Pulmonary effort is normal. No respiratory distress.     Breath sounds: Normal breath sounds.  Abdominal:     Palpations: Abdomen is soft.     Tenderness: There is abdominal tenderness. There is no guarding or rebound.     Comments: Mild left lower quadrant pain, no guarding or rebound  No appreciable hernias  Genitourinary:    Comments: No testicular tenderness Musculoskeletal: Normal range of motion.        General: No tenderness.     Comments: No CVA tenderness  Skin:    General: Skin is warm.     Capillary Refill: Capillary refill takes less than 2 seconds.  Neurological:     General: No focal deficit present.     Mental Status: He is alert and oriented to person, place, and  time. Mental status is at baseline.     Cranial Nerves: No cranial nerve deficit.     Motor: No abnormal muscle tone.     Coordination: Coordination normal.     Comments: No ataxia on finger to nose bilaterally. No pronator drift. 5/5 strength throughout. CN 2-12 intact.Equal grip strength. Sensation intact.   Psychiatric:        Behavior: Behavior normal.      ED Treatments / Results  Labs (all labs ordered are listed, but only abnormal results are displayed) Labs Reviewed  URINALYSIS, ROUTINE W REFLEX MICROSCOPIC - Abnormal; Notable  for the following components:      Result Value   Ketones, ur 5 (*)    All other components within normal limits  CBC WITH DIFFERENTIAL/PLATELET - Abnormal; Notable for the following components:   Neutro Abs 7.8 (*)    All other components within normal limits  COMPREHENSIVE METABOLIC PANEL - Abnormal; Notable for the following components:   CO2 21 (*)    Glucose, Bld 152 (*)    Total Bilirubin 1.3 (*)    All other components within normal limits  LACTIC ACID, PLASMA - Abnormal; Notable for the following components:   Lactic Acid, Venous 2.3 (*)    All other components within normal limits  LIPASE, BLOOD  LACTIC ACID, PLASMA  LACTIC ACID, PLASMA  LACTIC ACID, PLASMA    EKG None  Radiology Ct Abdomen Pelvis W Contrast  Result Date: 12/14/2018 CLINICAL DATA:  Left lower quadrant pain for the past 2 days. EXAM: CT ABDOMEN AND PELVIS WITH CONTRAST TECHNIQUE: Multidetector CT imaging of the abdomen and pelvis was performed using the standard protocol following bolus administration of intravenous contrast. CONTRAST:  157mL OMNIPAQUE IOHEXOL 300 MG/ML  SOLN COMPARISON:  MRI abdomen dated June 30, 2004. FINDINGS: Lower chest: No acute abnormality. Hepatobiliary: Tiny subcentimeter low-density lesion in the left hepatic lobe consistent with a cyst, unchanged since 2005. No new focal liver abnormality. The gallbladder is unremarkable. No biliary  dilatation. Pancreas: Unremarkable. No pancreatic ductal dilatation or surrounding inflammatory changes. Spleen: Normal size.  Unchanged 2.0 cm hemangioma. Adrenals/Urinary Tract: The adrenal glands and right kidney are unremarkable. There is a 6 mm calculus at the left UPJ with resultant mild hydronephrosis and perinephric fat stranding. The bladder is unremarkable. Stomach/Bowel: Stomach is within normal limits. Prior appendectomy. No evidence of bowel wall thickening, distention, or inflammatory changes. Vascular/Lymphatic: Aortic atherosclerosis. No enlarged abdominal or pelvic lymph nodes. Reproductive: Mild prostatomegaly. Other: Small fat containing umbilical and right inguinal hernias. No free fluid or pneumoperitoneum. Musculoskeletal: No acute or significant osseous findings. Facet mediated grade 1 anterolisthesis at L4-L5 and L5-S1. IMPRESSION: 1. 6 mm calculus at the left UPJ with resultant mild hydronephrosis. 2.  Aortic atherosclerosis (ICD10-I70.0). Electronically Signed   By: Titus Dubin M.D.   On: 12/14/2018 07:02    Procedures Procedures (including critical care time)  Medications Ordered in ED Medications  morphine 4 MG/ML injection 4 mg (has no administration in time range)  ondansetron (ZOFRAN) injection 4 mg (has no administration in time range)  sodium chloride 0.9 % bolus 1,000 mL (has no administration in time range)     Initial Impression / Assessment and Plan / ED Course  I have reviewed the triage vital signs and the nursing notes.  Pertinent labs & imaging results that were available during my care of the patient were reviewed by me and considered in my medical decision making (see chart for details).       Intermittent left-sided lower abdominal pain for the past several days that became acutely worse at 1 AM.  Patient appears comfortable.  There has been no fever.  No urinary symptoms.  Consider kidney stone versus diverticulitis. Will check labs and  urinalysis and treat symptoms.  Urinalysis negative for hematuria or infection.  Labs reassuring with normal creatinine.  CT scan will be obtained to evaluate for possible diverticulitis versus kidney stone.  CT confirms 6 mm stone to the left UPJ with hydronephrosis.  Patient still having pain and will be given another dose of pain medication.  No vomiting.  His urinalysis is negative for infection.  Discussed follow-up with urology.  Discussed he will need to return to the ED with worsening pain, vomiting, inability urinate or fever.  Anticipate discharge home after tolerating p.o. and lactate has cleared. Dr. Reather Converse to assume care at shift change.  Final Clinical Impressions(s) / ED Diagnoses   Final diagnoses:  Ureteral stone    ED Discharge Orders    None       Raeleen Winstanley, Annie Main, MD 12/14/18 [phone]

## 2018-12-14 NOTE — ED Notes (Signed)
Date and time results received: 12/14/18 0629 (use smartphrase ".now" to insert current time)  Test: lactic acid Critical Value: 2.3  Name of Provider Notified: Dr Wyvonnia Dusky  Orders Received? Or Actions Taken?: Actions Taken: orders received.

## 2018-12-17 ENCOUNTER — Encounter: Payer: Self-pay | Admitting: Family Medicine

## 2018-12-18 ENCOUNTER — Other Ambulatory Visit: Payer: Self-pay | Admitting: *Deleted

## 2018-12-18 MED ORDER — OXYCODONE-ACETAMINOPHEN 5-325 MG PO TABS: 1.0000 | ORAL_TABLET | ORAL | 0 refills | Status: DC | PRN

## 2018-12-19 ENCOUNTER — Telehealth: Payer: Self-pay | Admitting: Family Medicine

## 2018-12-19 NOTE — Telephone Encounter (Signed)
Requesting to speak to [facility]- apt scheduled for 5/13.

## 2018-12-20 ENCOUNTER — Ambulatory Visit (INDEPENDENT_AMBULATORY_CARE_PROVIDER_SITE_OTHER): Payer: BLUE CROSS/BLUE SHIELD | Admitting: Family Medicine

## 2018-12-20 ENCOUNTER — Other Ambulatory Visit: Payer: Self-pay

## 2018-12-20 ENCOUNTER — Encounter: Payer: Self-pay | Admitting: Family Medicine

## 2018-12-20 DIAGNOSIS — N201 Calculus of ureter: Secondary | ICD-10-CM | POA: Insufficient documentation

## 2018-12-20 DIAGNOSIS — E782 Mixed hyperlipidemia: Secondary | ICD-10-CM

## 2018-12-20 DIAGNOSIS — E559 Vitamin D deficiency, unspecified: Secondary | ICD-10-CM | POA: Diagnosis not present

## 2018-12-20 DIAGNOSIS — I700 Atherosclerosis of aorta: Secondary | ICD-10-CM

## 2018-12-20 DIAGNOSIS — N5201 Erectile dysfunction due to arterial insufficiency: Secondary | ICD-10-CM

## 2018-12-20 DIAGNOSIS — J9801 Acute bronchospasm: Secondary | ICD-10-CM

## 2018-12-20 DIAGNOSIS — N400 Enlarged prostate without lower urinary tract symptoms: Secondary | ICD-10-CM

## 2018-12-20 DIAGNOSIS — K219 Gastro-esophageal reflux disease without esophagitis: Secondary | ICD-10-CM

## 2018-12-20 MED ORDER — ALBUTEROL SULFATE HFA 108 (90 BASE) MCG/ACT IN AERS: 2.0000 | INHALATION_SPRAY | Freq: Four times a day (QID) | RESPIRATORY_TRACT | 3 refills | Status: DC | PRN

## 2018-12-20 NOTE — Progress Notes (Signed)
Virtual Visit Via telephone Note I connected with@ on 12/20/18 by telephone and verified that I am speaking with the correct person or authorized healthcare agent using two identifiers. Michael Clark is currently located at home and there are no unauthorized people in close proximity. I completed this visit while in a private location in my home .  This visit type was conducted due to national recommendations for restrictions regarding the COVID-19 Pandemic (e.g. social distancing).  This format is felt to be most appropriate for this patient at this time.  All issues noted in this document were discussed and addressed.  No physical exam was performed.    I discussed the limitations, risks, security and privacy concerns of performing an evaluation and management service by telephone and the availability of in person appointments. I also discussed with the patient that there may be a patient responsible charge related to this service. The patient expressed understanding and agreed to proceed.   Date:  12/20/2018    ID:  [name]      1955/11/05        [id number]   Patient Care Team Patient Care Team: Chipper Herb, MD as PCP - General (Family Medicine)  Reason for Visit: Primary Care Follow-up     History of Present Illness & Review of Systems:     Michael Clark is a 63 y.o. year old male primary care patient that presents today for a telehealth visit.  The patient is pleasant and doing well except for severe pain requiring pain medicine every 4-6 hours.  A trip to the [place] has been postponed and he needs to see the urologist as soon as possible, he also needs to come in for his regular physical exam with blood work.  The patient today denies any chest pain pressure tightness or shortness of breath.  He has had some wheezing recently but this seems to be getting better.  He does need a refill on his albuterol inhaler.  He is no longer taking any medicine for his reflux but  does have occasional heartburn and I encouraged him to at least pick up some Pepcid AC to take as needed and prior to eating foods that might bother his stomach.  He denies any blood in the stool black tarry bowel movements or change in bowel habits.  He said he had a colonoscopy 5 years ago and was told that he needed another one 10 years after that colonoscopy.  He is having some urgency but no blood in the urine currently other than abdominal bloating and pain.  The CT scan that was done this past week showed a left ureteral stone at the junction of the ureter and the bladder.  Review of systems as stated, otherwise negative.  The patient does not have symptoms concerning for COVID-19 infection (fever, chills, cough, or new shortness of breath).      Current Medications (Verified) Allergies as of 12/20/2018   No Known Allergies     Medication List       Accurate as of Dec 20, 2018  7:35 AM. If you have any questions, ask your nurse or doctor.        albuterol 108 (90 Base) MCG/ACT inhaler Commonly known as:  VENTOLIN HFA Inhale 2 puffs into the lungs every 6 (six) hours as needed for wheezing or shortness of breath.   cholecalciferol 1000 units tablet Commonly known as:  VITAMIN D Take 1,000 Units by  mouth daily.   doxycycline 100 MG tablet Commonly known as:  VIBRA-TABS Take 1 tablet (100 mg total) by mouth 2 (two) times daily. 1 po bid   fluticasone 50 MCG/ACT nasal spray Commonly known as:  FLONASE Place 2 sprays into both nostrils daily. As needed   ibuprofen 600 MG tablet Commonly known as:  ADVIL Take 1 tablet (600 mg total) by mouth every 6 (six) hours as needed.   multivitamin with minerals Tabs tablet Take 1 tablet by mouth daily.   ondansetron 4 MG disintegrating tablet Commonly known as:  Zofran ODT Take 1 tablet (4 mg total) by mouth every 8 (eight) hours as needed for nausea or vomiting.   oxyCODONE-acetaminophen 5-325 MG tablet Commonly known as:   PERCOCET/ROXICET Take 1 tablet by mouth every 4 (four) hours as needed for severe pain.   tamsulosin 0.4 MG Caps capsule Commonly known as:  Flomax Take 1 capsule (0.4 mg total) by mouth daily.           Allergies (Verified)    Patient has no known allergies.  Past Medical History Past Medical History:  Diagnosis Date  . BPH (benign prostatic hyperplasia)   . GERD (gastroesophageal reflux disease)   . Hyperlipidemia   . Trigger finger, left   . Vitamin D deficiency      Past Surgical History:  Procedure Laterality Date  . APPENDECTOMY    . CARPAL TUNNEL RELEASE  2006   LEFT   . EYE SURGERY Bilateral    cataracts  . LEFT THUM PULLEY RELEASE  2006--  DONE W/ CARPAL TUNNEL RELEASE  . TRIGGER FINGER RELEASE  07/16/2011   Procedure: RELEASE TRIGGER FINGER/A-1 PULLEY;  Surgeon: [name];  Location: [place];  Service: Orthopedics;  Laterality: Left;    Social History   Socioeconomic History  . Marital status: Married    Spouse name: Not on file  . Number of children: Not on file  . Years of education: Not on file  . Highest education level: Not on file  Occupational History  . Not on file  Social Needs  . Financial resource strain: Not on file  . Food insecurity:    Worry: Not on file    Inability: Not on file  . Transportation needs:    Medical: Not on file    Non-medical: Not on file  Tobacco Use  . Smoking status: Former Smoker    Last attempt to quit: 10/17/2004    Years since quitting: 14.1  . Smokeless tobacco: Former Network engineer and Sexual Activity  . Alcohol use: Yes    Alcohol/week: 10.0 standard drinks    Types: 10 Cans of beer per week  . Drug use: No  . Sexual activity: Not on file  Lifestyle  . Physical activity:    Days per week: Not on file    Minutes per session: Not on file  . Stress: Not on file  Relationships  . Social connections:    Talks on phone: Not on file    Gets together: Not on file    Attends  religious service: Not on file    Active member of club or organization: Not on file    Attends meetings of clubs or organizations: Not on file    Relationship status: Not on file  Other Topics Concern  . Not on file  Social History Narrative  . Not on file     Family History  Problem Relation Age of  Onset  . Alzheimer's disease Mother   . Cancer Father        prostate  . Diabetes Father       Labs/Other Tests and Data Reviewed:    Wt Readings from Last 3 Encounters:  12/14/18 205 lb (93 kg)  06/22/18 201 lb (91.2 kg)  04/28/18 203 lb (92.1 kg)   Temp Readings from Last 3 Encounters:  12/14/18 98 F (36.7 C) (Oral)  06/22/18 97.6 F (36.4 C) (Oral)  04/28/18 (!) 97.3 F (36.3 C) (Oral)   BP Readings from Last 3 Encounters:  12/14/18 117/81  06/22/18 109/66  04/28/18 128/81   Pulse Readings from Last 3 Encounters:  12/14/18 (!) 45  06/22/18 (!) 54  04/28/18 61     No results found for: HGBA1C Lab Results  Component Value Date   LDLCALC 102 (H) 06/22/2018   CREATININE 1.09 12/14/2018       Chemistry      Component Value Date/Time   NA 136 12/14/2018 0542   NA 138 06/22/2018 1126   K 3.8 12/14/2018 0542   CL 103 12/14/2018 0542   CO2 21 (L) 12/14/2018 0542   BUN 14 12/14/2018 0542   BUN 10 06/22/2018 1126   CREATININE 1.09 12/14/2018 0542      Component Value Date/Time   CALCIUM 9.3 12/14/2018 0542   ALKPHOS 53 12/14/2018 0542   AST 26 12/14/2018 0542   ALT 24 12/14/2018 0542   BILITOT 1.3 (H) 12/14/2018 0542   BILITOT 0.8 06/22/2018 1126         OBSERVATIONS/ OBJECTIVE:     The patient is in a lot of pain currently and needs to see the urologist and from his visit to the emergency room in [place] was not given an appointment to see a urologist.  The pain is much worse.  His weight is 209 at home.  He has a blood pressure monitor but has not been checking his blood pressures regularly.  He is due to get a physical exam in our office but  we will delay this until he can get the stone evaluation completed.  Physical exam deferred due to nature of telephonic visit.  ASSESSMENT & PLAN    Time:   Today, I have spent 25 minutes with the patient via telephone discussing the above including Covid precautions.     Visit Diagnoses: 1. Vitamin D deficiency -Continue vitamin D 2000 units daily pending results of lab work  2. Mixed hyperlipidemia -Continue with aggressive therapeutic lifestyle changes  3. Gastroesophageal reflux disease, esophagitis presence not specified -Watch diet closely and take Pepcid AC prior to eating twice daily if needed for reflux and symptoms  4. Benign prostatic hyperplasia without lower urinary tract symptoms -Rectal exam with physical exam and check PSA  5.  Left ureteral vesicle stone -Patient in a lot of pain and we will get appointment as soon as possible  6.  Aortic atherosclerosis -Continue aggressive therapeutic lifestyle changes to include diet and exercise and start statin therapy if necessary for elevated cholesterol  7.  Bronchospasm -Use albuterol inhaler as needed and take Mucinex maximum strength, 1 twice daily with a large glass of water as needed for cough and congestion  8.  Erectile dysfunction -No complaints today with this.  Patient Instructions  Continue to drink plenty of fluids and stay well-hydrated Follow-up with urologist as planned and if needed Continue to practice good hand and respiratory hygiene Please come by the office at your convenience  and get routine lab work without a PSA. Get urinalysis if possible Needs rectal exam and full physical exam in the office More importantly, needs visit to urologist as soon as possible because of worsening pain with left ureteral stone      The above assessment and management plan was discussed with the patient. The patient verbalized understanding of and has agreed to the management plan. Patient is aware to call the  clinic if symptoms persist or worsen. Patient is aware when to return to the clinic for a follow-up visit. Patient educated on when it is appropriate to go to the emergency department.    Chipper Herb, MD [facility] [address], [place], [place] [address] Ph [phone]   Arrie Senate MD

## 2018-12-20 NOTE — Addendum Note (Signed)
Addended by: Zannie Cove on: 12/20/2018 08:28 AM   Modules accepted: Orders

## 2018-12-20 NOTE — Patient Instructions (Addendum)
Continue to drink plenty of fluids and stay well-hydrated Follow-up with urologist as planned and if needed Continue to practice good hand and respiratory hygiene Please come by the office at your convenience and get routine lab work without a PSA. Get urinalysis if possible Needs rectal exam and full physical exam in the office More importantly, needs visit to urologist as soon as possible because of worsening pain with left ureteral stone

## 2018-12-21 DIAGNOSIS — N201 Calculus of ureter: Secondary | ICD-10-CM | POA: Diagnosis not present

## 2018-12-22 ENCOUNTER — Ambulatory Visit: Payer: BLUE CROSS/BLUE SHIELD | Admitting: Family Medicine

## 2018-12-25 ENCOUNTER — Ambulatory Visit: Payer: BLUE CROSS/BLUE SHIELD | Admitting: Family Medicine

## 2019-01-12 DIAGNOSIS — N201 Calculus of ureter: Secondary | ICD-10-CM | POA: Diagnosis not present

## 2019-01-15 ENCOUNTER — Other Ambulatory Visit: Payer: BC Managed Care – PPO

## 2019-01-15 ENCOUNTER — Other Ambulatory Visit: Payer: Self-pay

## 2019-01-15 DIAGNOSIS — N400 Enlarged prostate without lower urinary tract symptoms: Secondary | ICD-10-CM

## 2019-01-15 DIAGNOSIS — Z125 Encounter for screening for malignant neoplasm of prostate: Secondary | ICD-10-CM | POA: Diagnosis not present

## 2019-01-15 DIAGNOSIS — E782 Mixed hyperlipidemia: Secondary | ICD-10-CM | POA: Diagnosis not present

## 2019-01-15 NOTE — Addendum Note (Signed)
Addended by: Earlene Plater on: 01/15/2019 11:37 AM   Modules accepted: Orders

## 2019-01-16 ENCOUNTER — Other Ambulatory Visit: Payer: Self-pay

## 2019-01-16 LAB — CMP14+EGFR
ALT: 19 IU/L (ref 0–44)
AST: 29 IU/L (ref 0–40)
Albumin/Globulin Ratio: 2 (ref 1.2–2.2)
Albumin: 4.5 g/dL (ref 3.8–4.8)
Alkaline Phosphatase: 59 IU/L (ref 39–117)
BUN/Creatinine Ratio: 16 (ref 10–24)
BUN: 15 mg/dL (ref 8–27)
Bilirubin Total: 1 mg/dL (ref 0.0–1.2)
CO2: 19 mmol/L — ABNORMAL LOW (ref 20–29)
Calcium: 9.7 mg/dL (ref 8.6–10.2)
Chloride: 104 mmol/L (ref 96–106)
Creatinine, Ser: 0.95 mg/dL (ref 0.76–1.27)
GFR calc Af Amer: 97 mL/min/{1.73_m2} (ref >59)
GFR calc non Af Amer: 84 mL/min/{1.73_m2} (ref >59)
Globulin, Total: 2.2 g/dL (ref 1.5–4.5)
Glucose: 106 mg/dL — ABNORMAL HIGH (ref 65–99)
Potassium: 4.3 mmol/L (ref 3.5–5.2)
Sodium: 140 mmol/L (ref 134–144)
Total Protein: 6.7 g/dL (ref 6.0–8.5)

## 2019-01-16 LAB — CBC WITH DIFFERENTIAL/PLATELET
Basophils Absolute: 0 10*3/uL (ref 0.0–0.2)
Basos: 1 %
EOS (ABSOLUTE): 0.1 10*3/uL (ref 0.0–0.4)
Eos: 1 %
Hematocrit: 46.8 % (ref 37.5–51.0)
Hemoglobin: 15.9 g/dL (ref 13.0–17.7)
Immature Grans (Abs): 0 10*3/uL (ref 0.0–0.1)
Immature Granulocytes: 1 %
Lymphocytes Absolute: 2.4 10*3/uL (ref 0.7–3.1)
Lymphs: 42 %
MCH: 31.5 pg (ref 26.6–33.0)
MCHC: 34 g/dL (ref 31.5–35.7)
MCV: 93 fL (ref 79–97)
Monocytes Absolute: 0.6 10*3/uL (ref 0.1–0.9)
Monocytes: 12 %
Neutrophils Absolute: 2.4 10*3/uL (ref 1.4–7.0)
Neutrophils: 43 %
Platelets: 218 10*3/uL (ref 150–450)
RBC: 5.04 x10E6/uL (ref 4.14–5.80)
RDW: 12.9 % (ref 11.6–15.4)
WBC: 5.5 10*3/uL (ref 3.4–10.8)

## 2019-01-16 LAB — LIPID PANEL
Chol/HDL Ratio: 3.6 ratio (ref 0.0–5.0)
Cholesterol, Total: 174 mg/dL (ref 100–199)
HDL: 49 mg/dL (ref >39)
LDL Calculated: 94 mg/dL (ref 0–99)
Triglycerides: 156 mg/dL — ABNORMAL HIGH (ref 0–149)
VLDL Cholesterol Cal: 31 mg/dL (ref 5–40)

## 2019-01-16 LAB — PSA, TOTAL AND FREE
PSA, Free Pct: 27 %
PSA, Free: 0.54 ng/mL
Prostate Specific Ag, Serum: 2 ng/mL (ref 0.0–4.0)

## 2019-01-16 LAB — TSH: TSH: 1.26 u[IU]/mL (ref 0.450–4.500)

## 2019-01-17 ENCOUNTER — Ambulatory Visit (INDEPENDENT_AMBULATORY_CARE_PROVIDER_SITE_OTHER): Payer: BC Managed Care – PPO | Admitting: Family Medicine

## 2019-01-17 ENCOUNTER — Other Ambulatory Visit: Payer: Self-pay

## 2019-01-17 ENCOUNTER — Encounter: Payer: Self-pay | Admitting: Family Medicine

## 2019-01-17 VITALS — BP 106/67 | HR 66 | Temp 97.6°F | Ht 71.0 in | Wt 202.4 lb

## 2019-01-17 DIAGNOSIS — Z0000 Encounter for general adult medical examination without abnormal findings: Secondary | ICD-10-CM

## 2019-01-17 DIAGNOSIS — E559 Vitamin D deficiency, unspecified: Secondary | ICD-10-CM | POA: Diagnosis not present

## 2019-01-17 DIAGNOSIS — Z0001 Encounter for general adult medical examination with abnormal findings: Secondary | ICD-10-CM | POA: Diagnosis not present

## 2019-01-17 DIAGNOSIS — E7800 Pure hypercholesterolemia, unspecified: Secondary | ICD-10-CM | POA: Diagnosis not present

## 2019-01-17 DIAGNOSIS — K219 Gastro-esophageal reflux disease without esophagitis: Secondary | ICD-10-CM | POA: Diagnosis not present

## 2019-01-17 DIAGNOSIS — N400 Enlarged prostate without lower urinary tract symptoms: Secondary | ICD-10-CM

## 2019-01-17 NOTE — Progress Notes (Signed)
BP 106/67   Pulse 66   Temp 97.6 F (36.4 C) (Oral)   Ht '5\' 11"'  (1.803 m)   Wt 202 lb 6.4 oz (91.8 kg)   BMI 28.23 kg/m    Subjective:   Patient ID: Michael Clark, male    DOB: 07/11/1956, 63 y.o.   MRN: [id number]  HPI: Michael Clark is a 63 y.o. male presenting on 01/17/2019 for Establish Care and Annual Exam   HPI Adult well exam and physical Patient is coming in for adult well exam and physical and recheck of chronic issues.  He had blood work done last week and everything looks great on the blood work and controlled including his cholesterol and his prostate and he is doing relatively very well.  He has no health complaints today and is trying to do things to maintain his health currently.  Relevant past medical, surgical, family and social history reviewed and updated as indicated. Interim medical history since our last visit reviewed. Allergies and medications reviewed and updated.  Review of Systems  Constitutional: Negative for chills and fever.  HENT: Negative for ear pain and tinnitus.   Respiratory: Negative for cough, shortness of breath and wheezing.   Cardiovascular: Negative for chest pain, palpitations and leg swelling.  Gastrointestinal: Negative for abdominal pain, blood in stool, constipation and diarrhea.  Genitourinary: Negative for decreased urine volume, difficulty urinating, dysuria and hematuria.  Musculoskeletal: Negative for back pain and myalgias.  Skin: Negative for rash.  Neurological: Negative for dizziness, weakness and headaches.  Psychiatric/Behavioral: Negative for suicidal ideas.    Per HPI unless specifically indicated above   Allergies as of 01/17/2019   No Known Allergies     Medication List       Accurate as of January 17, 2019 10:29 AM. If you have any questions, ask your nurse or doctor.        STOP taking these medications   ondansetron 4 MG disintegrating tablet Commonly known as:  Zofran ODT Stopped by:  Worthy Rancher, MD   oxyCODONE-acetaminophen 5-325 MG tablet Commonly known as:  PERCOCET/ROXICET Stopped by:  Fransisca Kaufmann Beth Goodlin, MD   tamsulosin 0.4 MG Caps capsule Commonly known as:  Flomax Stopped by:  Worthy Rancher, MD     TAKE these medications   albuterol 108 (90 Base) MCG/ACT inhaler Commonly known as:  VENTOLIN HFA Inhale 2 puffs into the lungs every 6 (six) hours as needed for wheezing or shortness of breath.   cholecalciferol 1000 units tablet Commonly known as:  VITAMIN D Take 2,000 Units by mouth daily.   fluticasone 50 MCG/ACT nasal spray Commonly known as:  FLONASE Place 2 sprays into both nostrils daily. As needed   ibuprofen 600 MG tablet Commonly known as:  ADVIL Take 1 tablet (600 mg total) by mouth every 6 (six) hours as needed.   multivitamin with minerals Tabs tablet Take 1 tablet by mouth daily.        Objective:   BP 106/67   Pulse 66   Temp 97.6 F (36.4 C) (Oral)   Ht '5\' 11"'  (1.803 m)   Wt 202 lb 6.4 oz (91.8 kg)   BMI 28.23 kg/m   Wt Readings from Last 3 Encounters:  01/17/19 202 lb 6.4 oz (91.8 kg)  12/14/18 205 lb (93 kg)  06/22/18 201 lb (91.2 kg)    Physical Exam Vitals signs and nursing note reviewed.  Constitutional:      General: He is not  in acute distress.    Appearance: He is well-developed. He is not diaphoretic.  HENT:     Right Ear: External ear normal.     Left Ear: External ear normal.     Nose: Nose normal.     Mouth/Throat:     Pharynx: No oropharyngeal exudate.  Eyes:     General: No scleral icterus.       Right eye: No discharge.     Conjunctiva/sclera: Conjunctivae normal.     Pupils: Pupils are equal, round, and reactive to light.  Neck:     Musculoskeletal: Neck supple.     Thyroid: No thyromegaly.  Cardiovascular:     Rate and Rhythm: Normal rate and regular rhythm.     Heart sounds: Normal heart sounds. No murmur.  Pulmonary:     Effort: Pulmonary effort is normal. No respiratory distress.      Breath sounds: Normal breath sounds. No wheezing.  Abdominal:     General: Bowel sounds are normal. There is no distension.     Palpations: Abdomen is soft.     Tenderness: There is no abdominal tenderness. There is no guarding or rebound.  Genitourinary:    Prostate: Enlarged. Not tender and no nodules present.  Musculoskeletal: Normal range of motion.  Lymphadenopathy:     Cervical: No cervical adenopathy.  Skin:    General: Skin is warm and dry.     Findings: No rash.  Neurological:     Mental Status: He is alert and oriented to person, place, and time.     Coordination: Coordination normal.  Psychiatric:        Behavior: Behavior normal.     Results for orders placed or performed in visit on 01/15/19  PSA, total and free  Result Value Ref Range   Prostate Specific Ag, Serum 2.0 0.0 - 4.0 ng/mL   PSA, Free 0.54 N/A ng/mL   PSA, Free Pct 27.0 %  TSH  Result Value Ref Range   TSH 1.260 0.450 - 4.500 uIU/mL  CBC with Differential/Platelet  Result Value Ref Range   WBC 5.5 3.4 - 10.8 x10E3/uL   RBC 5.04 4.14 - 5.80 x10E6/uL   Hemoglobin 15.9 13.0 - 17.7 g/dL   Hematocrit 46.8 37.5 - 51.0 %   MCV 93 79 - 97 fL   MCH 31.5 26.6 - 33.0 pg   MCHC 34.0 31.5 - 35.7 g/dL   RDW 12.9 11.6 - 15.4 %   Platelets 218 150 - 450 x10E3/uL   Neutrophils 43 Not Estab. %   Lymphs 42 Not Estab. %   Monocytes 12 Not Estab. %   Eos 1 Not Estab. %   Basos 1 Not Estab. %   Neutrophils Absolute 2.4 1.4 - 7.0 x10E3/uL   Lymphocytes Absolute 2.4 0.7 - 3.1 x10E3/uL   Monocytes Absolute 0.6 0.1 - 0.9 x10E3/uL   EOS (ABSOLUTE) 0.1 0.0 - 0.4 x10E3/uL   Basophils Absolute 0.0 0.0 - 0.2 x10E3/uL   Immature Granulocytes 1 Not Estab. %   Immature Grans (Abs) 0.0 0.0 - 0.1 x10E3/uL  Lipid panel  Result Value Ref Range   Cholesterol, Total 174 100 - 199 mg/dL   Triglycerides 156 (H) 0 - 149 mg/dL   HDL 49 >39 mg/dL   VLDL Cholesterol Cal 31 5 - 40 mg/dL   LDL Calculated 94 0 - 99 mg/dL    Chol/HDL Ratio 3.6 0.0 - 5.0 ratio  CMP14+EGFR  Result Value Ref Range   Glucose 106 (H)  65 - 99 mg/dL   BUN 15 8 - 27 mg/dL   Creatinine, Ser 0.95 0.76 - 1.27 mg/dL   GFR calc non Af Amer 84 >59 mL/min/1.73   GFR calc Af Amer 97 >59 mL/min/1.73   BUN/Creatinine Ratio 16 10 - 24   Sodium 140 134 - 144 mmol/L   Potassium 4.3 3.5 - 5.2 mmol/L   Chloride 104 96 - 106 mmol/L   CO2 19 (L) 20 - 29 mmol/L   Calcium 9.7 8.6 - 10.2 mg/dL   Total Protein 6.7 6.0 - 8.5 g/dL   Albumin 4.5 3.8 - 4.8 g/dL   Globulin, Total 2.2 1.5 - 4.5 g/dL   Albumin/Globulin Ratio 2.0 1.2 - 2.2   Bilirubin Total 1.0 0.0 - 1.2 mg/dL   Alkaline Phosphatase 59 39 - 117 IU/L   AST 29 0 - 40 IU/L   ALT 19 0 - 44 IU/L    Assessment & Plan:   Problem List Items Addressed This Visit      Digestive   GERD (gastroesophageal reflux disease)     Genitourinary   BPH (benign prostatic hyperplasia)     Other   Vitamin D deficiency   Hyperlipidemia    Other Visit Diagnoses    Well adult exam    -  Primary       Follow up plan: Return in about 1 year (around 01/17/2020), or if symptoms worsen or fail to improve, for Well exam.  Counseling provided for all of the vaccine components No orders of the defined types were placed in this encounter.   Caryl Pina, MD [facility] 01/17/2019, 10:29 AM

## 2019-02-26 DIAGNOSIS — D2239 Melanocytic nevi of other parts of face: Secondary | ICD-10-CM | POA: Diagnosis not present

## 2019-02-26 DIAGNOSIS — L821 Other seborrheic keratosis: Secondary | ICD-10-CM | POA: Diagnosis not present

## 2019-02-26 DIAGNOSIS — L570 Actinic keratosis: Secondary | ICD-10-CM | POA: Diagnosis not present

## 2019-02-26 DIAGNOSIS — D485 Neoplasm of uncertain behavior of skin: Secondary | ICD-10-CM | POA: Diagnosis not present

## 2019-04-20 DIAGNOSIS — N201 Calculus of ureter: Secondary | ICD-10-CM | POA: Diagnosis not present

## 2019-06-07 DIAGNOSIS — C44311 Basal cell carcinoma of skin of nose: Secondary | ICD-10-CM | POA: Diagnosis not present

## 2019-08-27 DIAGNOSIS — C44311 Basal cell carcinoma of skin of nose: Secondary | ICD-10-CM | POA: Diagnosis not present

## 2019-11-22 DIAGNOSIS — D2261 Melanocytic nevi of right upper limb, including shoulder: Secondary | ICD-10-CM | POA: Diagnosis not present

## 2019-11-22 DIAGNOSIS — D0439 Carcinoma in situ of skin of other parts of face: Secondary | ICD-10-CM | POA: Diagnosis not present

## 2019-11-22 DIAGNOSIS — C44319 Basal cell carcinoma of skin of other parts of face: Secondary | ICD-10-CM | POA: Diagnosis not present

## 2019-11-22 DIAGNOSIS — Z85828 Personal history of other malignant neoplasm of skin: Secondary | ICD-10-CM | POA: Diagnosis not present

## 2019-11-22 DIAGNOSIS — L821 Other seborrheic keratosis: Secondary | ICD-10-CM | POA: Diagnosis not present

## 2019-11-22 DIAGNOSIS — N200 Calculus of kidney: Secondary | ICD-10-CM | POA: Diagnosis not present

## 2019-11-22 DIAGNOSIS — D225 Melanocytic nevi of trunk: Secondary | ICD-10-CM | POA: Diagnosis not present

## 2019-11-22 DIAGNOSIS — R1032 Left lower quadrant pain: Secondary | ICD-10-CM | POA: Diagnosis not present

## 2019-11-28 ENCOUNTER — Telehealth (INDEPENDENT_AMBULATORY_CARE_PROVIDER_SITE_OTHER): Payer: BC Managed Care – PPO | Admitting: Family Medicine

## 2019-11-28 ENCOUNTER — Encounter: Payer: Self-pay | Admitting: Family Medicine

## 2019-11-28 DIAGNOSIS — K219 Gastro-esophageal reflux disease without esophagitis: Secondary | ICD-10-CM

## 2019-11-28 DIAGNOSIS — N5201 Erectile dysfunction due to arterial insufficiency: Secondary | ICD-10-CM | POA: Diagnosis not present

## 2019-11-28 MED ORDER — TADALAFIL 5 MG PO TABS: 5.0000 mg | ORAL_TABLET | Freq: Every day | ORAL | 2 refills | Status: DC | PRN

## 2019-11-28 MED ORDER — PANTOPRAZOLE SODIUM 40 MG PO TBEC: 40.0000 mg | DELAYED_RELEASE_TABLET | Freq: Every day | ORAL | 3 refills | Status: DC

## 2019-11-28 NOTE — Progress Notes (Signed)
Virtual Visit via Mychart video Note  I connected with Michael Clark on 11/28/19 at 0931 by video and verified that I am speaking with the correct person using two identifiers. Michael Clark is currently located at home and no other people are currently with her during visit. The provider, Michael Kaufmann Alexcia Schools, MD is located in their office at time of visit.  Call ended at [phone]  I discussed the limitations, risks, security and privacy concerns of performing an evaluation and management service by video and the availability of in person appointments. I also discussed with the patient that there may be a patient responsible charge related to this service. The patient expressed understanding and agreed to proceed.   History and Present Illness: patient is calling in for acid reflux and belching acid and is having more increased issues.  Denies blood in stool. Denies abdominal pains.  Worse at night. He doesn't eat late at night.  He is gaining weight and trying to change diet. He does not drink late at night.  No diagnosis found.  Outpatient Encounter Medications as of 11/28/2019  Medication Sig  . albuterol (VENTOLIN HFA) 108 (90 Base) MCG/ACT inhaler Inhale 2 puffs into the lungs every 6 (six) hours as needed for wheezing or shortness of breath.  . cholecalciferol (VITAMIN D) 1000 units tablet Take 2,000 Units by mouth daily.   . fluticasone (FLONASE) 50 MCG/ACT nasal spray Place 2 sprays into both nostrils daily. As needed  . ibuprofen (ADVIL) 600 MG tablet Take 1 tablet (600 mg total) by mouth every 6 (six) hours as needed.  . Multiple Vitamin (MULTIVITAMIN WITH MINERALS) TABS tablet Take 1 tablet by mouth daily.   No facility-administered encounter medications on file as of 11/28/2019.    Review of Systems  Constitutional: Negative for chills and fever.  Eyes: Negative for visual disturbance.  Respiratory: Negative for shortness of breath and wheezing.   Cardiovascular: Negative  for chest pain and leg swelling.  Gastrointestinal: Positive for abdominal pain. Negative for constipation, diarrhea, nausea and vomiting.  Musculoskeletal: Negative for back pain and gait problem.  Skin: Negative for rash.  Neurological: Negative for dizziness, weakness and numbness.  All other systems reviewed and are negative.   Observations/Objective: Patient sounds comfortable and in no acute distress  Assessment and Plan: Problem List Items Addressed This Visit      Digestive   GERD (gastroesophageal reflux disease) - Primary   Relevant Medications   pantoprazole (PROTONIX) 40 MG tablet     Other   Erectile dysfunction      Patient had erectile dysfunction the past would like a refill of the Cialis, will send that for him, will try Protonix to help with his acid reflux and if he takes it during times of need for few months and then stop start another months Follow up plan: Return if symptoms worsen or fail to improve.     I discussed the assessment and treatment plan with the patient. The patient was provided an opportunity to ask questions and all were answered. The patient agreed with the plan and demonstrated an understanding of the instructions.   The patient was advised to call back or seek an in-person evaluation if the symptoms worsen or if the condition fails to improve as anticipated.  The above assessment and management plan was discussed with the patient. The patient verbalized understanding of and has agreed to the management plan. Patient is aware to call the clinic if symptoms persist  or worsen. Patient is aware when to return to the clinic for a follow-up visit. Patient educated on when it is appropriate to go to the emergency department.    I provided 7 minutes of non-face-to-face time during this encounter.    Michael Rancher, MD

## 2019-11-29 ENCOUNTER — Ambulatory Visit (INDEPENDENT_AMBULATORY_CARE_PROVIDER_SITE_OTHER): Payer: BC Managed Care – PPO | Admitting: *Deleted

## 2019-11-29 ENCOUNTER — Other Ambulatory Visit: Payer: Self-pay

## 2019-11-29 DIAGNOSIS — Z0000 Encounter for general adult medical examination without abnormal findings: Secondary | ICD-10-CM

## 2019-11-29 NOTE — Progress Notes (Signed)
MEDICARE ANNUAL WELLNESS VISIT  11/29/2019  Telephone Visit Disclaimer This Medicare AWV was conducted by telephone due to national recommendations for restrictions regarding the COVID-19 Pandemic (e.g. social distancing).  I verified, using two identifiers, that I am speaking with Michael Clark or their authorized healthcare agent. I discussed the limitations, risks, security, and privacy concerns of performing an evaluation and management service by telephone and the potential availability of an in-person appointment in the future. The patient expressed understanding and agreed to proceed.   Subjective:  Michael Clark is a 64 y.o. male patient of Dettinger, Fransisca Kaufmann, MD who had a Medicare Annual Wellness Visit today via telephone. Salif is Retired and lives with their spouse. he has 2 children. he reports that he is socially active and does interact with friends/family regularly. he is minimally physically active and enjoys golfing and fishing.  Patient Care Team: Dettinger, Fransisca Kaufmann, MD as PCP - General (Family Medicine)  Advanced Directives 11/29/2019 12/14/2018 07/16/2011 07/14/2011  Does Patient Have a Medical Advance Directive? Yes No Patient has advance directive, copy not in chart Patient has advance directive, copy not in chart  Type of Advance Directive Living will - Living will Living will  Does patient want to make changes to medical advance directive? No - Patient declined - - -  Copy of Jessamine in Chart? - - Copy requested from family -  Would patient like information on creating a medical advance directive? - No - Patient declined - -  Pre-existing out of facility DNR order (yellow form or pink MOST form) - - No No    Hospital Utilization Over the Past 12 Months: # of hospitalizations or ER visits: 0 # of surgeries: 1  Review of Systems    Patient reports that his overall health is unchanged compared to last year.  History obtained from the  patient  Patient Reported Readings (BP, Pulse, CBG, Weight, etc) none  Pain Assessment       Current Medications & Allergies (verified) Allergies as of 11/29/2019   No Known Allergies     Medication List       Accurate as of November 29, 2019  2:41 PM. If you have any questions, ask your nurse or doctor.        STOP taking these medications   albuterol 108 (90 Base) MCG/ACT inhaler Commonly known as: VENTOLIN HFA     TAKE these medications   cholecalciferol 1000 units tablet Commonly known as: VITAMIN D Take 2,000 Units by mouth daily.   fluticasone 50 MCG/ACT nasal spray Commonly known as: FLONASE Place 2 sprays into both nostrils daily. As needed   ibuprofen 600 MG tablet Commonly known as: ADVIL Take 1 tablet (600 mg total) by mouth every 6 (six) hours as needed.   multivitamin with minerals Tabs tablet Take 1 tablet by mouth daily.   pantoprazole 40 MG tablet Commonly known as: PROTONIX Take 1 tablet (40 mg total) by mouth daily.   tadalafil 5 MG tablet Commonly known as: Cialis Take 1 tablet (5 mg total) by mouth daily as needed for erectile dysfunction.       History (reviewed): Past Medical History:  Diagnosis Date  . BPH (benign prostatic hyperplasia)   . GERD (gastroesophageal reflux disease)   . Hyperlipidemia   . Trigger finger, left   . Vitamin D deficiency    Past Surgical History:  Procedure Laterality Date  . APPENDECTOMY    . CARPAL  TUNNEL RELEASE  2006   LEFT   . EYE SURGERY Bilateral    cataracts  . LEFT THUM PULLEY RELEASE  2006--  DONE W/ CARPAL TUNNEL RELEASE  . TRIGGER FINGER RELEASE  07/16/2011   Procedure: RELEASE TRIGGER FINGER/A-1 PULLEY;  Surgeon: [name];  Location: [facility];  Service: Orthopedics;  Laterality: Left;   Family History  Problem Relation Age of Onset  . Alzheimer's disease Mother   . Cancer Father        prostate  . Diabetes Father    Social History   Socioeconomic  History  . Marital status: Married    Spouse name: Not on file  . Number of children: 2  . Years of education: 74  . Highest education level: Bachelor's degree (e.g., BA, AB, BS)  Occupational History  . Not on file  Tobacco Use  . Smoking status: Former Smoker    Quit date: 10/17/2004    Years since quitting: 15.1  . Smokeless tobacco: Former Network engineer and Sexual Activity  . Alcohol use: Yes    Alcohol/week: 10.0 standard drinks    Types: 10 Cans of beer per week  . Drug use: No  . Sexual activity: Not on file  Other Topics Concern  . Not on file  Social History Narrative  . Not on file   Social Determinants of Health   Financial Resource Strain:   . Difficulty of Paying Living Expenses:   Food Insecurity:   . Worried About Charity fundraiser in the Last Year:   . Arboriculturist in the Last Year:   Transportation Needs:   . Film/video editor (Medical):   Marland Kitchen Lack of Transportation (Non-Medical):   Physical Activity:   . Days of Exercise per Week:   . Minutes of Exercise per Session:   Stress:   . Feeling of Stress :   Social Connections:   . Frequency of Communication with Friends and Family:   . Frequency of Social Gatherings with Friends and Family:   . Attends Religious Services:   . Active Member of Clubs or Organizations:   . Attends Archivist Meetings:   Marland Kitchen Marital Status:     Activities of Daily Living In your present state of health, do you have any difficulty performing the following activities: 11/29/2019  Hearing? N  Vision? N  Difficulty concentrating or making decisions? N  Walking or climbing stairs? N  Dressing or bathing? N  Doing errands, shopping? N  Preparing Food and eating ? N  Using the Toilet? N  In the past six months, have you accidently leaked urine? N  Managing your Medications? N  Managing your Finances? N  Housekeeping or managing your Housekeeping? N  Some recent data might be hidden    Patient  Education/ Literacy What is the last grade level you completed in school?: 12  Exercise Current Exercise Habits: The patient does not participate in regular exercise at present, Exercise limited by: None identified  Diet Patient reports consuming 2 meals a day and 2 snack(s) a day Patient reports that his primary diet is: Regular Patient reports that she does have regular access to food.   Depression Screen PHQ 2/9 Scores 11/29/2019 01/17/2019 06/22/2018 04/28/2018 12/13/2017 06/14/2017 11/26/2016  PHQ - 2 Score 0 0 0 0 0 0 0     Fall Risk Fall Risk  11/29/2019 01/17/2019 06/22/2018 04/28/2018 12/13/2017  Falls in the past year? 0 0  0 No No     Objective:  SHAMONT REYNEN seemed alert and oriented and he participated appropriately during our telephone visit.  Blood Pressure Weight BMI  BP Readings from Last 3 Encounters:  01/17/19 106/67  12/14/18 117/81  06/22/18 109/66   Wt Readings from Last 3 Encounters:  01/17/19 202 lb 6.4 oz (91.8 kg)  12/14/18 205 lb (93 kg)  06/22/18 201 lb (91.2 kg)   BMI Readings from Last 1 Encounters:  01/17/19 28.23 kg/m    *Unable to obtain current vital signs, weight, and BMI due to telephone visit type  Hearing/Vision  . Esa did not seem to have difficulty with hearing/understanding during the telephone conversation . Reports that he has not had a formal eye exam by an eye care professional within the past year . Reports that he has not had a formal hearing evaluation within the past year *Unable to fully assess hearing and vision during telephone visit type  Cognitive Function: 6CIT Screen 11/29/2019  What Year? 0 points  What month? 0 points  What time? 0 points  Count back from 20 0 points  Months in reverse 2 points  Repeat phrase 0 points  Total Score 2   (Normal:0-7, Significant for Dysfunction: >8)  Normal Cognitive Function Screening: Yes   Immunization & Health Maintenance Record Immunization History  Administered Date(s)  Administered  . Influenza Inj Mdck Quad Pf 05/10/2017  . Influenza,inj,Quad PF,6+ Mos 05/30/2013, 05/27/2015, 06/08/2016, 07/11/2018  . Influenza-Unspecified 05/29/2014  . Tdap 07/08/2010  . Zoster 04/03/2013    Health Maintenance  Topic Date Due  . Hepatitis C Screening  Never done  . COVID-19 Vaccine (1) Never done  . COLON CANCER SCREENING ANNUAL FOBT  Never done  . PNA vac Low Risk Adult (1 of 2 - PCV13) Never done  . INFLUENZA VACCINE  03/09/2020  . TETANUS/TDAP  07/08/2020  . COLONOSCOPY  10/08/2025       Assessment  This is a routine wellness examination for RAAHIM GOLAS.  Health Maintenance: Due or Overdue Health Maintenance Due  Topic Date Due  . Hepatitis C Screening  Never done  . COVID-19 Vaccine (1) Never done  . COLON CANCER SCREENING ANNUAL FOBT  Never done  . PNA vac Low Risk Adult (1 of 2 - PCV13) Never done    Michael Clark does not need a referral for Community Assistance: Care Management:   no Social Work:    no Prescription Assistance:  no Nutrition/Diabetes Education:  no   Plan:  Personalized Goals Goals Addressed            This Visit's Progress   . Increase physical activity        Personalized Health Maintenance & Screening Recommendations  Pneumococcal vaccine  Colorectal cancer screening  Shingles  Lung Cancer Screening Recommended: no (Low Dose CT Chest recommended if Age 67-80 years, 30 pack-year currently smoking OR have quit w/in past 15 years) Hepatitis C Screening recommended: yes HIV Screening recommended: no  Advanced Directives: Written information was not prepared per patient's request.  Referrals & Orders No orders of the defined types were placed in this encounter.   Follow-up Plan . Follow-up with Dettinger, Fransisca Kaufmann, MD as planned . Pt retired last year and stays active playing golf and fishing. Marland Kitchen He is due Pneumonia vaccine and shingles vaccine. We discussed and he will think about it and let us know  at next visit. Marland Kitchen He also needs Hep C testing and  FOBT. . Pt denies any problems with hearing or vision. . Pt has a living will, I requested copy for our records. . Pt voices no concerns . AVS printed and mailed to pt .      I have personally reviewed and noted the following in the patient's chart:   . Medical and social history . Use of alcohol, tobacco or illicit drugs  . Current medications and supplements . Functional ability and status . Nutritional status . Physical activity . Advanced directives . List of other physicians . Hospitalizations, surgeries, and ER visits in previous 12 months . Vitals . Screenings to include cognitive, depression, and falls . Referrals and appointments  In addition, I have reviewed and discussed with Michael Clark certain preventive protocols, quality metrics, and best practice recommendations. A written personalized care plan for preventive services as well as general preventive health recommendations is available and can be mailed to the patient at his request.      Sharee Holster  [phone]

## 2019-12-10 ENCOUNTER — Encounter: Payer: Self-pay | Admitting: Family Medicine

## 2019-12-10 ENCOUNTER — Telehealth (INDEPENDENT_AMBULATORY_CARE_PROVIDER_SITE_OTHER): Payer: BC Managed Care – PPO | Admitting: Family Medicine

## 2019-12-10 DIAGNOSIS — J0100 Acute maxillary sinusitis, unspecified: Secondary | ICD-10-CM

## 2019-12-10 MED ORDER — PSEUDOEPHEDRINE-GUAIFENESIN ER 120-1200 MG PO TB12: 1.0000 | ORAL_TABLET | Freq: Two times a day (BID) | ORAL | 0 refills | Status: DC

## 2019-12-10 MED ORDER — AMOXICILLIN-POT CLAVULANATE 875-125 MG PO TABS: 1.0000 | ORAL_TABLET | Freq: Two times a day (BID) | ORAL | 0 refills | Status: DC

## 2019-12-10 NOTE — Progress Notes (Signed)
    Subjective:    Patient ID: Michael Clark, male    DOB: 1956-06-09, 64 y.o.   MRN: [id number]   HPI: Michael Clark is a 64 y.o. male presenting for Symptoms include congestion, facial pain, nasal congestion, green productive cough, post nasal drip and sinus pressure. There is no fever, chills, or sweats. Onset of symptoms was a three weeks ago, gradually worsening since that time. xyzal and sudafed not sufficient for relief OTC    Depression screen [facility] 2/9 11/29/2019 01/17/2019 06/22/2018 04/28/2018 12/13/2017  Decreased Interest 0 0 0 0 0  Down, Depressed, Hopeless 0 0 0 0 0  PHQ - 2 Score 0 0 0 0 0     Relevant past medical, surgical, family and social history reviewed and updated as indicated.  Interim medical history since our last visit reviewed. Allergies and medications reviewed and updated.  ROS:  Review of Systems  Constitutional: Negative for activity change, appetite change, chills and fever.  HENT: Positive for congestion, postnasal drip, rhinorrhea and sinus pressure. Negative for trouble swallowing.   Respiratory: Positive for cough and chest tightness. Negative for shortness of breath.   Skin: Negative for rash.     Social History   Tobacco Use  Smoking Status Former Smoker  . Quit date: 10/17/2004  . Years since quitting: 15.1  Smokeless Tobacco Former User       Objective:     Wt Readings from Last 3 Encounters:  01/17/19 202 lb 6.4 oz (91.8 kg)  12/14/18 205 lb (93 kg)  06/22/18 201 lb (91.2 kg)     Exam deferred. Pt. Harboring due to COVID 19. Phone visit performed.   Assessment & Plan:  No diagnosis found.  Meds ordered this encounter  Medications  . Pseudoephedrine-Guaifenesin [phone] MG TB12    Sig: Take 1 tablet by mouth 2 (two) times daily. For congestion    Dispense:  20 tablet    Refill:  0  . amoxicillin-clavulanate (AUGMENTIN) 875-125 MG tablet    Sig: Take 1 tablet by mouth 2 (two) times daily. Take all of this medication   Dispense:  20 tablet    Refill:  0    No orders of the defined types were placed in this encounter.     There are no diagnoses linked to this encounter.  Virtual Visit via telephone Note  I discussed the limitations, risks, security and privacy concerns of performing an evaluation and management service by telephone and the availability of in person appointments. The patient was identified with two identifiers. Pt.expressed understanding and agreed to proceed. Pt. Is at home. Dr. Livia Snellen is in his office.  Follow Up Instructions:   I discussed the assessment and treatment plan with the patient. The patient was provided an opportunity to ask questions and all were answered. The patient agreed with the plan and demonstrated an understanding of the instructions.   The patient was advised to call back or seek an in-person evaluation if the symptoms worsen or if the condition fails to improve as anticipated.   Total minutes including chart review and phone contact time: 8   Follow up plan: Return if symptoms worsen or fail to improve.  Claretta Fraise, MD [place]

## 2019-12-19 ENCOUNTER — Ambulatory Visit: Payer: BC Managed Care – PPO | Admitting: Family Medicine

## 2020-01-21 DIAGNOSIS — C44729 Squamous cell carcinoma of skin of left lower limb, including hip: Secondary | ICD-10-CM | POA: Diagnosis not present

## 2020-02-17 ENCOUNTER — Other Ambulatory Visit: Payer: Self-pay | Admitting: Family Medicine

## 2020-02-17 DIAGNOSIS — K219 Gastro-esophageal reflux disease without esophagitis: Secondary | ICD-10-CM

## 2020-02-29 ENCOUNTER — Telehealth (INDEPENDENT_AMBULATORY_CARE_PROVIDER_SITE_OTHER): Payer: Medicare Other | Admitting: Family Medicine

## 2020-02-29 ENCOUNTER — Encounter: Payer: Self-pay | Admitting: Family Medicine

## 2020-02-29 DIAGNOSIS — R82998 Other abnormal findings in urine: Secondary | ICD-10-CM | POA: Diagnosis not present

## 2020-02-29 DIAGNOSIS — R399 Unspecified symptoms and signs involving the genitourinary system: Secondary | ICD-10-CM

## 2020-02-29 LAB — URINALYSIS, COMPLETE
Bilirubin, UA: NEGATIVE
Glucose, UA: NEGATIVE
Ketones, UA: NEGATIVE
Leukocytes,UA: NEGATIVE
Nitrite, UA: NEGATIVE
Protein,UA: NEGATIVE
Specific Gravity, UA: 1.025 (ref 1.005–1.030)
Urobilinogen, Ur: 0.2 mg/dL (ref 0.2–1.0)
pH, UA: 5.5 (ref 5.0–7.5)

## 2020-02-29 LAB — MICROSCOPIC EXAMINATION
Bacteria, UA: NONE SEEN
RBC, Urine: >30 /hpf — AB (ref 0–2)
Renal Epithel, UA: NONE SEEN /hpf
WBC, UA: NONE SEEN /hpf (ref 0–5)

## 2020-02-29 NOTE — Progress Notes (Signed)
Virtual Visit via Telephone Note  I connected with Michael Clark on 03/01/20 at 4:55 PM by telephone and verified that I am speaking with the correct person using two identifiers. Michael Clark is currently located at home and his wife is currently with him during this visit. The provider, Loman Brooklyn, FNP is located in their office at time of visit.  I discussed the limitations, risks, security and privacy concerns of performing an evaluation and management service by telephone and the availability of in person appointments. I also discussed with the patient that there may be a patient responsible charge related to this service. The patient expressed understanding and agreed to proceed.  Subjective: PCP: Dettinger, Fransisca Kaufmann, MD  Chief Complaint  Patient presents with   Hematuria   Patient reports his urine has been dark lately. He denies any other UTI symptoms. He reports this morning between 2-4 AM he felt like he passed a kidney stone. He has not had any pain since. He is followed by urology [facility]). He just wanted to make sure he did not have a UTI.    ROS: Per HPI  Current Outpatient Medications:    cholecalciferol (VITAMIN D) 1000 units tablet, Take 2,000 Units by mouth daily. , Disp: , Rfl:    fluticasone (FLONASE) 50 MCG/ACT nasal spray, Place 2 sprays into both nostrils daily. As needed (Patient not taking: Reported on 11/29/2019), Disp: 16 g, Rfl: 11   ibuprofen (ADVIL) 600 MG tablet, Take 1 tablet (600 mg total) by mouth every 6 (six) hours as needed., Disp: 30 tablet, Rfl: 0   Multiple Vitamin (MULTIVITAMIN WITH MINERALS) TABS tablet, Take 1 tablet by mouth daily., Disp: , Rfl:    pantoprazole (PROTONIX) 40 MG tablet, TAKE ONE (1) TABLET EACH DAY, Disp: 30 tablet, Rfl: 3   Pseudoephedrine-Guaifenesin [phone] MG TB12, Take 1 tablet by mouth 2 (two) times daily. For congestion, Disp: 20 tablet, Rfl: 0   tadalafil (CIALIS) 5 MG tablet, Take 1 tablet (5  mg total) by mouth daily as needed for erectile dysfunction., Disp: 30 tablet, Rfl: 2  No Known Allergies Past Medical History:  Diagnosis Date   BPH (benign prostatic hyperplasia)    GERD (gastroesophageal reflux disease)    Hyperlipidemia    Trigger finger, left    Vitamin D deficiency     Observations/Objective: A&O  No respiratory distress or wheezing audible over the phone Mood, judgement, and thought processes all WNL   Assessment and Plan: 1. Dark urine - Discussed dark color of urine is due to blood in his urine. Suspect this is from the stone he passed this morning. Encouraged to keep follow-up with urologist.  - Urinalysis, Complete - Urine dipstick shows positive for RBC's.  Micro exam: > 30 RBC's per HPF. - Urine Culture   Follow Up Instructions:  I discussed the assessment and treatment plan with the patient. The patient was provided an opportunity to ask questions and all were answered. The patient agreed with the plan and demonstrated an understanding of the instructions.   The patient was advised to call back or seek an in-person evaluation if the symptoms worsen or if the condition fails to improve as anticipated.  The above assessment and management plan was discussed with the patient. The patient verbalized understanding of and has agreed to the management plan. Patient is aware to call the clinic if symptoms persist or worsen. Patient is aware when to return to the clinic for a follow-up  visit. Patient educated on when it is appropriate to go to the emergency department.   Time call ended: 5:01 PM  I provided 8 minutes of non-face-to-face time during this encounter.  Hendricks Limes, MSN, APRN, FNP-C [facility] 03/01/20

## 2020-03-02 LAB — URINE CULTURE: Organism ID, Bacteria: NO GROWTH

## 2020-03-06 DIAGNOSIS — R310 Gross hematuria: Secondary | ICD-10-CM | POA: Diagnosis not present

## 2020-03-06 DIAGNOSIS — N200 Calculus of kidney: Secondary | ICD-10-CM | POA: Diagnosis not present

## 2020-03-06 DIAGNOSIS — R1032 Left lower quadrant pain: Secondary | ICD-10-CM | POA: Diagnosis not present

## 2020-03-06 DIAGNOSIS — I700 Atherosclerosis of aorta: Secondary | ICD-10-CM | POA: Diagnosis not present

## 2020-03-07 ENCOUNTER — Other Ambulatory Visit (HOSPITAL_COMMUNITY): Payer: Medicare Other

## 2020-03-07 ENCOUNTER — Other Ambulatory Visit: Payer: Self-pay | Admitting: Urology

## 2020-03-10 ENCOUNTER — Other Ambulatory Visit (HOSPITAL_COMMUNITY)
Admission: RE | Admit: 2020-03-10 | Discharge: 2020-03-10 | Disposition: A | Discharge status: Home / Self Care | Payer: Medicare Other | Source: Ambulatory Visit | Attending: Urology | Admitting: Urology

## 2020-03-10 DIAGNOSIS — Z01812 Encounter for preprocedural laboratory examination: Secondary | ICD-10-CM | POA: Insufficient documentation

## 2020-03-10 DIAGNOSIS — Z20822 Contact with and (suspected) exposure to covid-19: Secondary | ICD-10-CM | POA: Diagnosis not present

## 2020-03-10 LAB — SARS CORONAVIRUS 2 (TAT 6-24 HRS): SARS Coronavirus 2: NEGATIVE

## 2020-03-10 NOTE — Progress Notes (Signed)
Pre op phone call completed. Pt is aware he is to arrive at [place] on Thursday morning.  NPO after midnight.  Pt is aware he is to not take Motrin until procedure.  COVID test being done today and pt will quarantine.

## 2020-03-12 NOTE — H&P (Signed)
I have kidney stones.  HPI: Michael Clark is a 64 year-old male established patient who is here for renal calculi.  12/21/2018: He developed left flank pain and presented to [facility] ER and on 5/7 was diagnosed with a 56mm Left proximal ureteral calculus. No flnak pain currently. NO new LUTS.   01/12/2019: KUB shows likely 34mm left lower pole calculus. no flank pain for several weeks.   04/20/2019: KUB shows stable 33mm left lower pole calculus. no flank pain.   11/22/2019: Here today for follow-up in regards to ongoing monitoring of her known left lower pole calculi. Denies any interval stone material passage. Over the past weekend he did have acute left lower quadrant abdominal pain that occurred after having some nausea/vomiting/diarrhea related to a gastrointestinal illness. All have since resolved. Voiding at his baseline without any bothersome frequency/urgency, changes in force of stream, burning or painful urination, visible blood in the urine.   03/06/2020: KUB at last office visit was grossly reassuring. He returns today for evaluation of a several week history of intermittent left lower quadrant abdominal pain and discomfort. Also associated with some mild intermittent gross hematuria. Patient not currently on any alpha-blocker therapy. He does endorse some increased urinary frequency from baseline but otherwise voiding symptoms grossly remain stable without any painful or burning urination, increased urgency. He denies any interval stone material passage. Symptoms not associated with fevers or chills, nausea/vomiting.   The problem is on the left side. He first stated noticing pain on approximately 02/07/2020. This is his first kidney stone. He is currently having groin pain. He denies having flank pain, back pain, nausea, vomiting, fever, and chills. He has not caught a stone in his urine strainer since his symptoms began.   He has never had surgical treatment for calculi in the past.      ALLERGIES: None    MEDICATIONS: Hydrocodone-Acetaminophen 5 mg-325 mg tablet 1 tablet PO Q 6 H PRN  Multiple Vitamin  Vitamin D2     GU PSH: No GU PSH      PSH Notes: Hand Reconstruction 1975.  "Cyst removed from neck" in 2015   NON-GU PSH: Appendectomy (laparoscopic) - 1998     GU PMH: LLQ pain - 11/22/2019 Renal calculus - 11/22/2019 Ureteral calculus - 04/20/2019, - 01/12/2019, - 12/21/2018    NON-GU PMH: Arthritis GERD Hypercholesterolemia    FAMILY HISTORY: 2 daughters - Daughter Death - Father, Mother Dementia - Mother nephrolithiasis - Brother Prostate Cancer - Father   SOCIAL HISTORY: Marital Status: Married Preferred Language: English; Ethnicity: Not Hispanic Or Latino; Race: White Current Smoking Status: Patient does not smoke anymore. Smoked for 30 years.   Tobacco Use Assessment Completed: Used Tobacco in last 30 days? Types of alcohol consumed: Beer. Social Drinker.  Does not drink caffeine. 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    REVIEW OF SYSTEMS:    GU Review Male:   Patient reports frequent urination and burning/ pain with urination. Patient denies hard to postpone urination, get up at night to urinate, leakage of urine, stream starts and stops, trouble starting your stream, have to strain to urinate , erection problems, and penile pain.  Gastrointestinal (Upper):   Patient denies nausea, vomiting, and indigestion/ heartburn.  Gastrointestinal (Lower):   Patient denies diarrhea and constipation.  Constitutional:   Patient denies fever, night sweats, weight loss, and fatigue.  Skin:   Patient denies skin rash/ lesion and itching.  Eyes:   Patient denies blurred vision and double vision.  Ears/  Nose/ Throat:   Patient denies sore throat and sinus problems.  Hematologic/Lymphatic:   Patient denies swollen glands and easy bruising.  Cardiovascular:   Patient denies leg swelling and chest pains.  Respiratory:   Patient denies cough and shortness of breath.   Endocrine:   Patient denies excessive thirst.  Musculoskeletal:   Patient denies back pain and joint pain.  Neurological:   Patient denies headaches and dizziness.  Psychologic:   Patient denies depression and anxiety.   Notes: gross hematuria. groin pain.     VITAL SIGNS:      03/06/2020 02:27 PM  Weight 215 lb / 97.52 kg  Height 70 in / 177.8 cm  BP 133/84 mmHg  Pulse 91 /min  Temperature 97.8 F / 36.5 C  BMI 30.8 kg/m   MULTI-SYSTEM PHYSICAL EXAMINATION:    Constitutional: Well-nourished. No physical deformities. Normally developed. Good grooming.  Neck: Neck symmetrical, not swollen. Normal tracheal position.  Respiratory: No labored breathing, no use of accessory muscles.   Cardiovascular: Normal temperature, normal extremity pulses, no swelling, no varicosities.  Skin: No paleness, no jaundice, no cyanosis. No lesion, no ulcer, no rash.  Neurologic / Psychiatric: Oriented to time, oriented to place, oriented to person. No depression, no anxiety, no agitation.  Gastrointestinal: No mass, no tenderness, no rigidity, non obese abdomen. No CVA, flank, lower abdominal tenderness.   Musculoskeletal: Normal gait and station of head and neck.     Complexity of Data:  Source Of History:  Patient, Medical Record Summary  Records Review:   Previous Doctor Records, Previous Hospital Records, Previous Patient Records  Urine Test Review:   Urinalysis  X-Ray Review: C.T. Stone Protocol: Reviewed Films. Discussed With Patient.     03/06/20  Urinalysis  Urine Appearance Clear   Urine Color Yellow   Urine Glucose Neg mg/dL  Urine Bilirubin Neg mg/dL  Urine Ketones Neg mg/dL  Urine Specific Gravity 1.030   Urine Blood 1+ ery/uL  Urine pH 5.5   Urine Protein Trace mg/dL  Urine Urobilinogen 0.2 mg/dL  Urine Nitrites Neg   Urine Leukocyte Esterase Neg leu/uL  Urine WBC/hpf NS (Not Seen)   Urine RBC/hpf 3 - 10/hpf   Urine Epithelial Cells 0 - 5/hpf   Urine Bacteria Rare (0-9/hpf)    Urine Mucous Not Present   Urine Yeast NS (Not Seen)   Urine Trichomonas Not Present   Urine Cystals NS (Not Seen)   Urine Casts NS (Not Seen)   Urine Sperm Not Present    PROCEDURES:         C.T. Urogram - [id number]      Patient confirmed No Neulasta OnPro Device.         Urinalysis w/Scope Dipstick Dipstick Cont'd Micro  Color: Yellow Bilirubin: Neg mg/dL WBC/hpf: NS (Not Seen)  Appearance: Clear Ketones: Neg mg/dL RBC/hpf: 3 - 10/hpf  Specific Gravity: 1.030 Blood: 1+ ery/uL Bacteria: Rare (0-9/hpf)  pH: 5.5 Protein: Trace mg/dL Cystals: NS (Not Seen)  Glucose: Neg mg/dL Urobilinogen: 0.2 mg/dL Casts: NS (Not Seen)    Nitrites: Neg Trichomonas: Not Present    Leukocyte Esterase: Neg leu/uL Mucous: Not Present      Epithelial Cells: 0 - 5/hpf      Yeast: NS (Not Seen)      Sperm: Not Present    ASSESSMENT:      ICD-10 Details  1 GU:   Renal calculus - N20.0 Left, Chronic, Exacerbation  2   LLQ pain - R10.32 Left, Acute, Systemic  Symptoms  3   Gross hematuria - R31.0 Undiagnosed New Problem   PLAN:            Medications New Meds: Tamsulosin Hcl 0.4 mg capsule 1 capsule PO Daily   #30  2 Refill(s)    Refill Meds: Hydrocodone-Acetaminophen 5 mg-325 mg tablet 1 tablet PO Q 6 H PRN   #15  0 Refill(s)            Orders Labs Urine Culture  X-Rays: C.T. Stone Protocol Without Contrast  X-Ray Notes: History:  Hematuria: Yes/No  Patient to see MD after exam: Yes/No  Previous exam: CT / IVP/ US/ KUB/ None  When:  Where:  Diabetic: Yes/ No  BUN/ Creatine:  Date of last BUN Creatinine:  Weight in pounds:  Allergy- Contrasts/ Shellfish: Yes/ No  Conflicting diabetic meds: Yes/ No  Oral contrast and instructions given to patient:   Prior Authorization #Vania Rea ref # [id number]            Schedule Return Visit/Planned Activity: Next Available Appointment - Schedule Surgery          Document Letter(s):  Created for Patient: Clinical Summary          Notes:   Previously identified stone now migrated from the lower pole into the renal collecting system/UPJ area. No other additional renal or ureteral calculi identified on today's exam. This is the same stone that resulted in symptoms and initial presentation to Urology last year where continued observation was ultimately decided upon after stone was noted to settle back down into the lower pole. Given his current symptoms, hematuria, and noted migration of the stone I feel he would benefit from definitive intervention. We discussed shockwave lithotripsy versus ureteroscopy. I will ultimately need to discuss presentation with 1 of the urologist here in clinic to determine appropriate treatment strategy before proceeding with definitive intervention.   For ureteroscopy I described the risks which include heart attack, stroke, pulmonary embolus, death, bleeding, infection, damage to contiguous structures, positioning injury, ureteral stricture, ureteral avulsion, ureteral injury, need for ureteral stent, inability to perform ureteroscopy, need for an interval procedure, inability to clear stone burden, stent discomfort and pain.   For shockwave lithotripsy I described the risks which include arrhythmia, kidney contusion, kidney hemorrhage, need for transfusion, long-term risk of diabetes or hypertension, back discomfort, flank ecchymosis, flank abrasion, inability to break up stone, inability to pass stone fragments, Steinstrasse, infection associated with obstructing stones, need for different surgical procedure and possible need for repeat shockwave lithotripsy.   I reviewed imaging with Dr. Milford Cage. Stone visible on scout imaging. Patient is a good candidate for shockwave lithotripsy. Discussed in detail with the patient. He elects to proceed. He will be scheduled in the near future to have this treated. Tamsulosin prescribed today. I will refill his pain medication. Reviewing controlled substance database  reporting System, he last had this filled in in the spring of this year when I saw him for an acute visit. Precautionary urine culture sent as well.

## 2020-03-13 ENCOUNTER — Other Ambulatory Visit: Payer: Self-pay

## 2020-03-13 ENCOUNTER — Ambulatory Visit (HOSPITAL_BASED_OUTPATIENT_CLINIC_OR_DEPARTMENT_OTHER)
Admission: RE | Admit: 2020-03-13 | Discharge: 2020-03-13 | Disposition: A | Discharge status: Home / Self Care | Payer: Medicare Other | Source: Ambulatory Visit | Attending: Urology | Admitting: Urology

## 2020-03-13 ENCOUNTER — Encounter (HOSPITAL_BASED_OUTPATIENT_CLINIC_OR_DEPARTMENT_OTHER)
Admission: RE | Disposition: A | Discharge status: Home / Self Care | Payer: Self-pay | Source: Ambulatory Visit | Attending: Urology

## 2020-03-13 ENCOUNTER — Encounter (HOSPITAL_BASED_OUTPATIENT_CLINIC_OR_DEPARTMENT_OTHER): Payer: Self-pay | Admitting: Urology

## 2020-03-13 ENCOUNTER — Ambulatory Visit (HOSPITAL_COMMUNITY): Payer: Medicare Other

## 2020-03-13 DIAGNOSIS — N200 Calculus of kidney: Secondary | ICD-10-CM | POA: Insufficient documentation

## 2020-03-13 DIAGNOSIS — Z79899 Other long term (current) drug therapy: Secondary | ICD-10-CM | POA: Insufficient documentation

## 2020-03-13 DIAGNOSIS — Z87891 Personal history of nicotine dependence: Secondary | ICD-10-CM | POA: Diagnosis not present

## 2020-03-13 DIAGNOSIS — Z91041 Radiographic dye allergy status: Secondary | ICD-10-CM | POA: Diagnosis not present

## 2020-03-13 DIAGNOSIS — Z91013 Allergy to seafood: Secondary | ICD-10-CM | POA: Insufficient documentation

## 2020-03-13 DIAGNOSIS — Z01818 Encounter for other preprocedural examination: Secondary | ICD-10-CM | POA: Diagnosis not present

## 2020-03-13 DIAGNOSIS — Z87442 Personal history of urinary calculi: Secondary | ICD-10-CM | POA: Diagnosis not present

## 2020-03-13 HISTORY — PX: EXTRACORPOREAL SHOCK WAVE LITHOTRIPSY: SHX1557

## 2020-03-13 SURGERY — LITHOTRIPSY, ESWL
Anesthesia: LOCAL | Laterality: Left

## 2020-03-13 MED ORDER — CIPROFLOXACIN HCL 500 MG PO TABS
ORAL_TABLET | ORAL | Status: AC
  Filled 2020-03-13: qty 1

## 2020-03-13 MED ORDER — DIAZEPAM 5 MG PO TABS
10.0000 mg | ORAL_TABLET | ORAL | Status: AC
  Administered 2020-03-13: 10 mg via ORAL

## 2020-03-13 MED ORDER — DIPHENHYDRAMINE HCL 25 MG PO CAPS
ORAL_CAPSULE | ORAL | Status: AC
  Filled 2020-03-13: qty 1

## 2020-03-13 MED ORDER — CIPROFLOXACIN HCL 500 MG PO TABS
500.0000 mg | ORAL_TABLET | Freq: Once | ORAL | Status: AC
  Administered 2020-03-13: 500 mg via ORAL

## 2020-03-13 MED ORDER — DIAZEPAM 5 MG PO TABS
ORAL_TABLET | ORAL | Status: AC
  Filled 2020-03-13: qty 2

## 2020-03-13 MED ORDER — SODIUM CHLORIDE 0.9% FLUSH: 3.0000 mL | Freq: Two times a day (BID) | INTRAVENOUS | Status: DC

## 2020-03-13 MED ORDER — HYDROCODONE-ACETAMINOPHEN 5-325 MG PO TABS: 2.0000 | ORAL_TABLET | Freq: Four times a day (QID) | ORAL | 0 refills | Status: DC | PRN

## 2020-03-13 MED ORDER — DIPHENHYDRAMINE HCL 25 MG PO CAPS
25.0000 mg | ORAL_CAPSULE | ORAL | Status: AC
  Administered 2020-03-13: 25 mg via ORAL

## 2020-03-13 MED ORDER — SODIUM CHLORIDE 0.9 % IV SOLN: INTRAVENOUS | Status: DC

## 2020-03-13 MED ORDER — CIPROFLOXACIN IN D5W 400 MG/200ML IV SOLN: 400.0000 mg | INTRAVENOUS | Status: DC

## 2020-03-13 NOTE — Interval H&P Note (Signed)
History and Physical Interval Note: No change in stone position.  03/13/2020 8:43 AM  [name]  has presented today for surgery, with the diagnosis of LEFT RENAL STONE.  The various methods of treatment have been discussed with the patient and family. After consideration of risks, benefits and other options for treatment, the patient has consented to  Procedure(s): EXTRACORPOREAL SHOCK WAVE LITHOTRIPSY (ESWL) (Left) as a surgical intervention.  The patient's history has been reviewed, patient examined, no change in status, stable for surgery.  I have reviewed the patient's chart and labs.  Questions were answered to the patient's satisfaction.     [name]

## 2020-03-13 NOTE — Discharge Instructions (Signed)
Lithotripsy, Care After This sheet gives you information about how to care for yourself after your procedure. Your health care provider may also give you more specific instructions. If you have problems or questions, contact your health care provider. What can I expect after the procedure? After the procedure, it is common to have:  Some blood in your urine. This should only last for a few days.  Soreness in your back, sides, or upper abdomen for a few days.  Blotches or bruises on your back where the pressure wave entered the skin.  Pain, discomfort, or nausea when pieces (fragments) of the kidney stone move through the tube that carries urine from the kidney to the bladder (ureter). Stone fragments may pass soon after the procedure, but they may continue to pass for up to 4-8 weeks. ? If you have severe pain or nausea, contact your health care provider. This may be caused by a large stone that was not broken up, and this may mean that you need more treatment.  Some pain or discomfort during urination.  Some pain or discomfort in the lower abdomen or (in men) at the base of the penis. Follow these instructions at home: Medicines  Take over-the-counter and prescription medicines only as told by your health care provider.  If you were prescribed an antibiotic medicine, take it as told by your health care provider. Do not stop taking the antibiotic even if you start to feel better.  Do not drive for 24 hours if you were given a medicine to help you relax (sedative).  Do not drive or use heavy machinery while taking prescription pain medicine. Eating and drinking      Drink enough water and fluids to keep your urine clear or pale yellow. This helps any remaining pieces of the stone to pass. It can also help prevent new stones from forming.  Eat plenty of fresh fruits and vegetables.  Follow instructions from your health care provider about eating and drinking restrictions. You may be  instructed: ? To reduce how much salt (sodium) you eat or drink. Check ingredients and nutrition facts on packaged foods and beverages. ? To reduce how much meat you eat.  Eat the recommended amount of calcium for your age and gender. Ask your health care provider how much calcium you should have. General instructions  Get plenty of rest.  Most people can resume normal activities 1-2 days after the procedure. Ask your health care provider what activities are safe for you.  Your health care provider may direct you to lie in a certain position (postural drainage) and tap firmly (percuss) over your kidney area to help stone fragments pass. Follow instructions as told by your health care provider.  If directed, strain all urine through the strainer that was provided by your health care provider. ? Keep all fragments for your health care provider to see. Any stones that are found may be sent to a medical lab for examination. The stone may be as small as a grain of salt.  Keep all follow-up visits as told by your health care provider. This is important. Contact a health care provider if:  You have pain that is severe or does not get better with medicine.  You have nausea that is severe or does not go away.  You have blood in your urine longer than your health care provider told you to expect.  You have more blood in your urine.  You have pain during urination that does   not go away.  You urinate more frequently than usual and this does not go away.  You develop a rash or any other possible signs of an allergic reaction. Get help right away if:  You have severe pain in your back, sides, or upper abdomen.  You have severe pain while urinating.  Your urine is very dark red.  You have blood in your stool (feces).  You cannot pass any urine at all.  You feel a strong urge to urinate after emptying your bladder.  You have a fever or chills.  You develop shortness of breath,  difficulty breathing, or chest pain.  You have severe nausea that leads to persistent vomiting.  You faint. Summary  After this procedure, it is common to have some pain, discomfort, or nausea when pieces (fragments) of the kidney stone move through the tube that carries urine from the kidney to the bladder (ureter). If this pain or nausea is severe, however, you should contact your health care provider.  Most people can resume normal activities 1-2 days after the procedure. Ask your health care provider what activities are safe for you.  Drink enough water and fluids to keep your urine clear or pale yellow. This helps any remaining pieces of the stone to pass, and it can help prevent new stones from forming.  If directed, strain your urine and keep all fragments for your health care provider to see. Fragments or stones may be as small as a grain of salt.  Get help right away if you have severe pain in your back, sides, or upper abdomen or have severe pain while urinating. This information is not intended to replace advice given to you by your health care provider. Make sure you discuss any questions you have with your health care provider. Document Revised: 11/06/2018 Document Reviewed: 06/16/2016 Elsevier Patient Education  2020 [employer]  

## 2020-03-17 ENCOUNTER — Encounter (HOSPITAL_BASED_OUTPATIENT_CLINIC_OR_DEPARTMENT_OTHER): Payer: Self-pay | Admitting: Urology

## 2020-04-01 DIAGNOSIS — N200 Calculus of kidney: Secondary | ICD-10-CM | POA: Diagnosis not present

## 2020-04-04 ENCOUNTER — Other Ambulatory Visit: Payer: Self-pay

## 2020-04-04 ENCOUNTER — Encounter: Payer: Self-pay | Admitting: Family Medicine

## 2020-04-04 ENCOUNTER — Ambulatory Visit (INDEPENDENT_AMBULATORY_CARE_PROVIDER_SITE_OTHER): Payer: Medicare Other | Admitting: Family Medicine

## 2020-04-04 VITALS — BP 125/83 | HR 76 | Temp 97.9°F | Ht 70.0 in | Wt 214.0 lb

## 2020-04-04 DIAGNOSIS — Z0001 Encounter for general adult medical examination with abnormal findings: Secondary | ICD-10-CM | POA: Diagnosis not present

## 2020-04-04 DIAGNOSIS — K219 Gastro-esophageal reflux disease without esophagitis: Secondary | ICD-10-CM | POA: Diagnosis not present

## 2020-04-04 DIAGNOSIS — Z0000 Encounter for general adult medical examination without abnormal findings: Secondary | ICD-10-CM

## 2020-04-04 DIAGNOSIS — E7800 Pure hypercholesterolemia, unspecified: Secondary | ICD-10-CM

## 2020-04-04 DIAGNOSIS — N400 Enlarged prostate without lower urinary tract symptoms: Secondary | ICD-10-CM

## 2020-04-04 DIAGNOSIS — Z1159 Encounter for screening for other viral diseases: Secondary | ICD-10-CM

## 2020-04-04 DIAGNOSIS — M25512 Pain in left shoulder: Secondary | ICD-10-CM

## 2020-04-04 NOTE — Progress Notes (Signed)
BP 125/83   Pulse 76   Temp 97.9 F (36.6 C)   Ht '5\' 10"'  (1.778 m)   Wt 214 lb (97.1 kg)   SpO2 96%   BMI 30.71 kg/m    Subjective:   Patient ID: Michael Clark, male    DOB: 26-Sep-1955, 64 y.o.   MRN: [id number]  HPI: Michael Clark is a 64 y.o. male presenting on 04/04/2020 for Medical Management of Chronic Issues   HPI Adult well exam and physical Patient is coming in today for adult well exam and physical and recheck of chronic issues.Patient denies any chest pain, shortness of breath, headaches or vision issues, abdominal complaints, diarrhea, nausea, vomiting. Patient is complaining of left shoulder pain with overhead range of motion, hurts more in the center near his sternum and close to his neck on that side with range of motion with the shoulder, it hurts with overhead range of motion more than anything.  It does not hurt him to golf or at rest but it does sometimes hurt him at night as well.  Patient denies any numbness or weakness.  Hyperlipidemia Patient is coming in for recheck of his hyperlipidemia. The patient is currently taking no medication currently we are monitoring.. They deny any issues with myalgias or history of liver damage from it. They deny any focal numbness or weakness or chest pain.   BPH Patient is coming in for recheck on BPH Symptoms: None currently Medication: None Last PSA: 1 year ago, normal  GERD Patient is currently on pantoprazole.  She denies any major symptoms or abdominal pain or belching or burping. She denies any blood in her stool or lightheadedness or dizziness.   Relevant past medical, surgical, family and social history reviewed and updated as indicated. Interim medical history since our last visit reviewed. Allergies and medications reviewed and updated.  Review of Systems  Constitutional: Negative for chills and fever.  Eyes: Negative for visual disturbance.  Respiratory: Negative for shortness of breath and wheezing.    Cardiovascular: Negative for chest pain and leg swelling.  Musculoskeletal: Positive for arthralgias. Negative for back pain and gait problem.  Skin: Negative for rash.  Neurological: Negative for dizziness, weakness and light-headedness.  All other systems reviewed and are negative.   Per HPI unless specifically indicated above   Allergies as of 04/04/2020   No Known Allergies     Medication List       Accurate as of April 04, 2020 10:36 AM. If you have any questions, ask your nurse or doctor.        STOP taking these medications   HYDROcodone-acetaminophen 5-325 MG tablet Commonly known as: NORCO/VICODIN Stopped by: Fransisca Kaufmann Mette [place]gate, MD     TAKE these medications   cholecalciferol 1000 units tablet Commonly known as: VITAMIN D Take 2,000 Units by mouth daily.   fluticasone 50 MCG/ACT nasal spray Commonly known as: FLONASE Place 2 sprays into both nostrils daily. As needed   ibuprofen 600 MG tablet Commonly known as: ADVIL Take 1 tablet (600 mg total) by mouth every 6 (six) hours as needed.   multivitamin with minerals Tabs tablet Take 1 tablet by mouth daily.   pantoprazole 40 MG tablet Commonly known as: PROTONIX TAKE ONE (1) TABLET EACH DAY   Pseudoephedrine-Guaifenesin [phone] MG Tb12 Take 1 tablet by mouth 2 (two) times daily. For congestion   tadalafil 5 MG tablet Commonly known as: Cialis Take 1 tablet (5 mg total) by mouth daily as needed  for erectile dysfunction.        Objective:   BP 125/83   Pulse 76   Temp 97.9 F (36.6 C)   Ht '5\' 10"'  (1.778 m)   Wt 214 lb (97.1 kg)   SpO2 96%   BMI 30.71 kg/m   Wt Readings from Last 3 Encounters:  04/04/20 214 lb (97.1 kg)  03/13/20 215 lb (97.5 kg)  01/17/19 202 lb 6.4 oz (91.8 kg)    Physical Exam Vitals and nursing note reviewed.  Constitutional:      General: He is not in acute distress.    Appearance: He is well-developed. He is not diaphoretic.  Eyes:     General: No scleral  icterus.    Conjunctiva/sclera: Conjunctivae normal.  Neck:     Thyroid: No thyromegaly.  Cardiovascular:     Rate and Rhythm: Normal rate and regular rhythm.     Heart sounds: Normal heart sounds. No murmur heard.   Pulmonary:     Effort: Pulmonary effort is normal. No respiratory distress.     Breath sounds: Normal breath sounds. No wheezing.  Abdominal:     General: Abdomen is flat. Bowel sounds are normal. There is no distension.     Tenderness: There is no abdominal tenderness. There is no guarding or rebound.  Musculoskeletal:        General: Normal range of motion.     Cervical back: Neck supple.  Lymphadenopathy:     Cervical: No cervical adenopathy.  Skin:    General: Skin is warm and dry.     Findings: No rash.  Neurological:     Mental Status: He is alert and oriented to person, place, and time.     Coordination: Coordination normal.  Psychiatric:        Behavior: Behavior normal.       Assessment & Plan:   Problem List Items Addressed This Visit      Digestive   GERD (gastroesophageal reflux disease)   Relevant Orders   CBC with Differential/Platelet     Genitourinary   BPH (benign prostatic hyperplasia)   Relevant Orders   PSA, total and free     Other   Hyperlipidemia   Relevant Orders   Lipid panel    Other Visit Diagnoses    Well adult exam    -  Primary   Relevant Orders   CBC with Differential/Platelet   CMP14+EGFR   Encounter for hepatitis C screening test for low risk patient       Relevant Orders   Hepatitis C antibody   Sternoclavicular joint pain, left        Recommended ibuprofen or Aleve twice a day for 3 weeks with food and see if the inflammation improves, if not we will reassess.  Continue current medication.  Follow up plan: Return in about 1 year (around 04/04/2021), or if symptoms worsen or fail to improve.  Counseling provided for all of the vaccine components Orders Placed This Encounter  Procedures  . Hepatitis C  antibody  . CBC with Differential/Platelet  . CMP14+EGFR  . Lipid panel  . PSA, total and free    Caryl Pina, MD [facility] 04/04/2020, 10:36 AM

## 2020-04-05 LAB — CMP14+EGFR
ALT: 23 IU/L (ref 0–44)
AST: 20 IU/L (ref 0–40)
Albumin/Globulin Ratio: 1.7 (ref 1.2–2.2)
Albumin: 4.5 g/dL (ref 3.8–4.8)
Alkaline Phosphatase: 91 IU/L (ref 48–121)
BUN/Creatinine Ratio: 16 (ref 10–24)
BUN: 17 mg/dL (ref 8–27)
Bilirubin Total: 0.8 mg/dL (ref 0.0–1.2)
CO2: 21 mmol/L (ref 20–29)
Calcium: 9.6 mg/dL (ref 8.6–10.2)
Chloride: 103 mmol/L (ref 96–106)
Creatinine, Ser: 1.09 mg/dL (ref 0.76–1.27)
GFR calc Af Amer: 81 mL/min/{1.73_m2} (ref >59)
GFR calc non Af Amer: 70 mL/min/{1.73_m2} (ref >59)
Globulin, Total: 2.7 g/dL (ref 1.5–4.5)
Glucose: 109 mg/dL — ABNORMAL HIGH (ref 65–99)
Potassium: 4.4 mmol/L (ref 3.5–5.2)
Sodium: 139 mmol/L (ref 134–144)
Total Protein: 7.2 g/dL (ref 6.0–8.5)

## 2020-04-05 LAB — CBC WITH DIFFERENTIAL/PLATELET
Basophils Absolute: 0.1 10*3/uL (ref 0.0–0.2)
Basos: 1 %
EOS (ABSOLUTE): 0.1 10*3/uL (ref 0.0–0.4)
Eos: 1 %
Hematocrit: 46 % (ref 37.5–51.0)
Hemoglobin: 16.1 g/dL (ref 13.0–17.7)
Immature Grans (Abs): 0.1 10*3/uL (ref 0.0–0.1)
Immature Granulocytes: 1 %
Lymphocytes Absolute: 2.9 10*3/uL (ref 0.7–3.1)
Lymphs: 40 %
MCH: 31.3 pg (ref 26.6–33.0)
MCHC: 35 g/dL (ref 31.5–35.7)
MCV: 89 fL (ref 79–97)
Monocytes Absolute: 0.6 10*3/uL (ref 0.1–0.9)
Monocytes: 9 %
Neutrophils Absolute: 3.5 10*3/uL (ref 1.4–7.0)
Neutrophils: 48 %
Platelets: 265 10*3/uL (ref 150–450)
RBC: 5.15 x10E6/uL (ref 4.14–5.80)
RDW: 12.4 % (ref 11.6–15.4)
WBC: 7.2 10*3/uL (ref 3.4–10.8)

## 2020-04-05 LAB — LIPID PANEL
Chol/HDL Ratio: 5.1 ratio — ABNORMAL HIGH (ref 0.0–5.0)
Cholesterol, Total: 221 mg/dL — ABNORMAL HIGH (ref 100–199)
HDL: 43 mg/dL (ref >39)
LDL Chol Calc (NIH): 134 mg/dL — ABNORMAL HIGH (ref 0–99)
Triglycerides: 244 mg/dL — ABNORMAL HIGH (ref 0–149)
VLDL Cholesterol Cal: 44 mg/dL — ABNORMAL HIGH (ref 5–40)

## 2020-04-05 LAB — PSA, TOTAL AND FREE
PSA, Free Pct: 23.3 %
PSA, Free: 0.49 ng/mL
Prostate Specific Ag, Serum: 2.1 ng/mL (ref 0.0–4.0)

## 2020-04-05 LAB — HEPATITIS C ANTIBODY: Hep C Virus Ab: <0.1 s/co ratio (ref 0.0–0.9)

## 2020-04-09 ENCOUNTER — Encounter: Payer: Self-pay | Admitting: Family Medicine

## 2020-04-10 MED ORDER — ROSUVASTATIN CALCIUM 5 MG PO TABS
5.0000 mg | ORAL_TABLET | Freq: Every day | ORAL | 3 refills | Status: DC
Start: 2020-04-10 — End: 2021-01-13

## 2020-05-27 ENCOUNTER — Encounter: Payer: Self-pay | Admitting: Family Medicine

## 2020-06-24 ENCOUNTER — Other Ambulatory Visit: Payer: Self-pay | Admitting: Family Medicine

## 2020-06-24 DIAGNOSIS — K219 Gastro-esophageal reflux disease without esophagitis: Secondary | ICD-10-CM

## 2020-07-02 ENCOUNTER — Encounter: Payer: Self-pay | Admitting: Family Medicine

## 2020-07-02 ENCOUNTER — Ambulatory Visit (INDEPENDENT_AMBULATORY_CARE_PROVIDER_SITE_OTHER): Payer: Medicare Other | Admitting: Family Medicine

## 2020-07-02 DIAGNOSIS — J40 Bronchitis, not specified as acute or chronic: Secondary | ICD-10-CM | POA: Diagnosis not present

## 2020-07-02 DIAGNOSIS — J329 Chronic sinusitis, unspecified: Secondary | ICD-10-CM | POA: Diagnosis not present

## 2020-07-02 MED ORDER — BENZONATATE 200 MG PO CAPS: 200.0000 mg | ORAL_CAPSULE | Freq: Three times a day (TID) | ORAL | 0 refills | Status: DC | PRN

## 2020-07-02 MED ORDER — AMOXICILLIN-POT CLAVULANATE 875-125 MG PO TABS: 1.0000 | ORAL_TABLET | Freq: Two times a day (BID) | ORAL | 0 refills | Status: DC

## 2020-07-02 NOTE — Progress Notes (Signed)
Subjective:    Patient ID: Michael Clark, male    DOB: [birth year], 64 y.o.   MRN: [id number]   HPI: Michael Clark is a 64 y.o. male presenting for bad cough. No CoVID test. No fever. No loss of taste or smll. Drainage down into lungs. Nose congested. Onset 2 weeks ago.    Depression screen [facility] 2/9 04/04/2020 11/29/2019 01/17/2019 06/22/2018 04/28/2018  Decreased Interest 0 0 0 0 0  Down, Depressed, Hopeless 0 0 0 0 0  PHQ - 2 Score 0 0 0 0 0     Relevant past medical, surgical, family and social history reviewed and updated as indicated.  Interim medical history since our last visit reviewed. Allergies and medications reviewed and updated.  ROS:  Review of Systems  Constitutional: Negative for activity change, appetite change, chills and fever.  HENT: Positive for congestion, postnasal drip, rhinorrhea and sinus pressure. Negative for ear discharge, ear pain, hearing loss, nosebleeds, sneezing and trouble swallowing.   Respiratory: Negative for chest tightness and shortness of breath.   Cardiovascular: Negative for chest pain and palpitations.  Skin: Negative for rash.     Social History   Tobacco Use  Smoking Status Former Smoker  . Quit date: 10/17/2004  . Years since quitting: 15.7  Smokeless Tobacco Former User       Objective:     Wt Readings from Last 3 Encounters:  04/04/20 214 lb (97.1 kg)  03/13/20 215 lb (97.5 kg)  01/17/19 202 lb 6.4 oz (91.8 kg)     Exam deferred. Pt. Harboring due to COVID 19. Phone visit performed.   Assessment & Plan:   1. Sinobronchitis     Meds ordered this encounter  Medications  . amoxicillin-clavulanate (AUGMENTIN) 875-125 MG tablet    Sig: Take 1 tablet by mouth 2 (two) times daily. Take all of this medication    Dispense:  20 tablet    Refill:  0  . benzonatate (TESSALON) 200 MG capsule    Sig: Take 1 capsule (200 mg total) by mouth 3 (three) times daily as needed for cough.    Dispense:  20 capsule    Refill:   0    No orders of the defined types were placed in this encounter.     Diagnoses and all orders for this visit:  Sinobronchitis  Other orders -     amoxicillin-clavulanate (AUGMENTIN) 875-125 MG tablet; Take 1 tablet by mouth 2 (two) times daily. Take all of this medication -     benzonatate (TESSALON) 200 MG capsule; Take 1 capsule (200 mg total) by mouth 3 (three) times daily as needed for cough.    Virtual Visit via telephone Note  I discussed the limitations, risks, security and privacy concerns of performing an evaluation and management service by telephone and the availability of in person appointments. The patient was identified with two identifiers. Pt.expressed understanding and agreed to proceed. Pt. Is at home. Dr. Livia Snellen is in his office.  Follow Up Instructions:   I discussed the assessment and treatment plan with the patient. The patient was provided an opportunity to ask questions and all were answered. The patient agreed with the plan and demonstrated an understanding of the instructions.   The patient was advised to call back or seek an in-person evaluation if the symptoms worsen or if the condition fails to improve as anticipated.   Total minutes including chart review and phone contact time: 15   Follow up plan:  Return if symptoms worsen or fail to improve.  Claretta Fraise, MD [place]

## 2020-07-11 ENCOUNTER — Ambulatory Visit: Payer: Medicare Other

## 2020-07-21 ENCOUNTER — Ambulatory Visit (INDEPENDENT_AMBULATORY_CARE_PROVIDER_SITE_OTHER): Payer: Medicare Other

## 2020-07-21 ENCOUNTER — Other Ambulatory Visit: Payer: Self-pay

## 2020-07-21 DIAGNOSIS — Z23 Encounter for immunization: Secondary | ICD-10-CM

## 2020-07-23 DIAGNOSIS — Z20822 Contact with and (suspected) exposure to covid-19: Secondary | ICD-10-CM | POA: Diagnosis not present

## 2020-07-28 DIAGNOSIS — R0981 Nasal congestion: Secondary | ICD-10-CM | POA: Diagnosis not present

## 2020-07-28 DIAGNOSIS — H5710 Ocular pain, unspecified eye: Secondary | ICD-10-CM | POA: Diagnosis not present

## 2020-07-28 DIAGNOSIS — J40 Bronchitis, not specified as acute or chronic: Secondary | ICD-10-CM | POA: Diagnosis not present

## 2020-07-28 DIAGNOSIS — R059 Cough, unspecified: Secondary | ICD-10-CM | POA: Diagnosis not present

## 2020-11-19 ENCOUNTER — Telehealth: Payer: Self-pay | Admitting: Family Medicine

## 2020-11-19 ENCOUNTER — Other Ambulatory Visit: Payer: Self-pay | Admitting: Family Medicine

## 2020-11-19 DIAGNOSIS — C44321 Squamous cell carcinoma of skin of nose: Secondary | ICD-10-CM | POA: Diagnosis not present

## 2020-11-19 DIAGNOSIS — Z85828 Personal history of other malignant neoplasm of skin: Secondary | ICD-10-CM | POA: Diagnosis not present

## 2020-11-19 DIAGNOSIS — E7800 Pure hypercholesterolemia, unspecified: Secondary | ICD-10-CM

## 2020-11-19 DIAGNOSIS — L570 Actinic keratosis: Secondary | ICD-10-CM | POA: Diagnosis not present

## 2020-11-19 DIAGNOSIS — K219 Gastro-esophageal reflux disease without esophagitis: Secondary | ICD-10-CM

## 2020-11-19 DIAGNOSIS — L814 Other melanin hyperpigmentation: Secondary | ICD-10-CM | POA: Diagnosis not present

## 2020-11-19 DIAGNOSIS — L820 Inflamed seborrheic keratosis: Secondary | ICD-10-CM | POA: Diagnosis not present

## 2020-11-19 DIAGNOSIS — D1801 Hemangioma of skin and subcutaneous tissue: Secondary | ICD-10-CM | POA: Diagnosis not present

## 2020-11-19 DIAGNOSIS — L821 Other seborrheic keratosis: Secondary | ICD-10-CM | POA: Diagnosis not present

## 2020-11-19 DIAGNOSIS — D225 Melanocytic nevi of trunk: Secondary | ICD-10-CM | POA: Diagnosis not present

## 2020-11-19 NOTE — Progress Notes (Signed)
Placed lab orders for patient 

## 2020-11-19 NOTE — Telephone Encounter (Signed)
Please advise 

## 2020-11-19 NOTE — Telephone Encounter (Signed)
Placed lab orders for patient 

## 2020-11-19 NOTE — Telephone Encounter (Signed)
Pt would like to have lab orders put in before his CPE appt

## 2020-11-19 NOTE — Telephone Encounter (Signed)
Patient aware.

## 2020-11-28 ENCOUNTER — Other Ambulatory Visit: Payer: Self-pay

## 2020-12-01 ENCOUNTER — Ambulatory Visit (INDEPENDENT_AMBULATORY_CARE_PROVIDER_SITE_OTHER): Payer: Medicare Other

## 2020-12-01 VITALS — Wt 215.0 lb

## 2020-12-01 DIAGNOSIS — Z0000 Encounter for general adult medical examination without abnormal findings: Secondary | ICD-10-CM

## 2020-12-01 NOTE — Patient Instructions (Signed)
[name] , Thank you for taking time to come for yourMedicare Wellness Visit. I appreciate your ongoing commitment to your health goals. Please review the following plan we discussed and let me know if I can assist you in the future.   Screening recommendations/referrals: Colonoscopy: Done 10/09/2015 - Repeat every 10 years Recommended yearly ophthalmology/optometry visit for glaucoma screening and checkup Recommended yearly dental visit for hygiene and checkup  Vaccinations: Influenza vaccine: Done 07/21/2020 - Repeat every fall Pneumococcal vaccine: Prevnar-13 Done 12/13/221 - DUE for Pneumovax-23 in 07/2021 Tdap vaccine: Done 07/08/2010 - Repeat every 10 years *DUE Shingles vaccine: Zostavax done 04/03/2013 - Shingrix discussed. Please contact your pharmacy for coverage information. *DUE Covid-19: Done 09/03/19, 09/24/19, & 05/06/2020 - DUE for 2nd booster  Advanced directives: Please bring a copy of your health care power of attorney and living will to the office to be added to your chart at your convenience.  Conditions/risks identified: Aim for 30 minutes of exercise or brisk walking each day, drink 6-8 glasses of water and eat lots of fruits and vegetables.  Next appointment: Follow up in one year for your annual wellness visit.   Preventive Care 32 Years and Older, Male  Preventive care refers to lifestyle choices and visits with your health care provider that can promote health and wellness. What does preventive care include?  A yearly physical exam. This is also called an annual well check.  Dental exams once or twice a year.  Routine eye exams. Ask your health care provider how often you should have your eyes checked.  Personal lifestyle choices, including: ? Daily care of your teeth and gums. ? Regular physical activity. ? Eating a healthy diet. ? Avoiding tobacco and drug use. ? Limiting alcohol use. ? Practicing safe sex. ? Taking low doses of aspirin every  day. ? Taking vitamin and mineral supplements as recommended by your health care provider. What happens during an annual well check? The services and screenings done by your health care provider during your annual well check will depend on your age, overall health, lifestyle risk factors, and family history of disease. Counseling  Your health care provider may ask you questions about your:  Alcohol use.  Tobacco use.  Drug use.  Emotional well-being.  Home and relationship well-being.  Sexual activity.  Eating habits.  History of falls.  Memory and ability to understand (cognition).  Work and work Statistician. Screening  You may have the following tests or measurements:  Height, weight, and BMI.  Blood pressure.  Lipid and cholesterol levels. These may be checked every 5 years, or more frequently if you are over 86 years old.  Skin check.  Lung cancer screening. You may have this screening every year starting at age 63 if you have a 30-pack-year history of smoking and currently smoke or have quit within the past 15 years.  Fecal occult blood test (FOBT) of the stool. You may have this test every year starting at age 4.  Flexible sigmoidoscopy or colonoscopy. You may have a sigmoidoscopy every 5 years or a colonoscopy every 10 years starting at age 4.  Prostate cancer screening. Recommendations will vary depending on your family history and other risks.  Hepatitis C blood test.  Hepatitis B blood test.  Sexually transmitted disease (STD) testing.  Diabetes screening. This is done by checking your blood sugar (glucose) after you have not eaten for a while (fasting). You may have this done every 1-3 years.  Abdominal aortic aneurysm (AAA)  screening. You may need this if you are a current or former smoker.  Osteoporosis. You may be screened starting at age 75 if you are at high risk. Talk with your health care provider about your test results, treatment options,  and if necessary, the need for more tests. Vaccines  Your health care provider may recommend certain vaccines, such as:  Influenza vaccine. This is recommended every year.  Tetanus, diphtheria, and acellular pertussis (Tdap, Td) vaccine. You may need a Td booster every 10 years.  Zoster vaccine. You may need this after age 45.  Pneumococcal 13-valent conjugate (PCV13) vaccine. One dose is recommended after age 39.  Pneumococcal polysaccharide (PPSV23) vaccine. One dose is recommended after age 67. Talk to your health care provider about which screenings and vaccines you need and how often you need them. This information is not intended to replace advice given to you by your health care provider. Make sure you discuss any questions you have with your health care provider. Document Released: 08/22/2015 Document Revised: 04/14/2016 Document Reviewed: 05/27/2015 Elsevier Interactive Patient Education  2017 [employer] [place] Prevention in the Home Falls can cause injuries. They can happen to people of all ages. There are many things you can do to make your home safe and to help prevent falls. What can I do on the outside of my home?  Regularly fix the edges of walkways and driveways and fix any cracks.  Remove anything that might make you trip as you walk through a door, such as a raised step or threshold.  Trim any bushes or trees on the path to your home.  Use bright outdoor lighting.  Clear any walking paths of anything that might make someone trip, such as rocks or tools.  Regularly check to see if handrails are loose or broken. Make sure that both sides of any steps have handrails.  Any raised decks and porches should have guardrails on the edges.  Have any leaves, snow, or ice cleared regularly.  Use sand or salt on walking paths during winter.  Clean up any spills in your garage right away. This includes oil or grease spills. What can I do in the bathroom?  Use night  lights.  Install grab bars by the toilet and in the tub and shower. Do not use towel bars as grab bars.  Use non-skid mats or decals in the tub or shower.  If you need to sit down in the shower, use a plastic, non-slip stool.  Keep the floor dry. Clean up any water that spills on the floor as soon as it happens.  Remove soap buildup in the tub or shower regularly.  Attach bath mats securely with double-sided non-slip rug tape.  Do not have throw rugs and other things on the floor that can make you trip. What can I do in the bedroom?  Use night lights.  Make sure that you have a light by your bed that is easy to reach.  Do not use any sheets or blankets that are too big for your bed. They should not hang down onto the floor.  Have a firm chair that has side arms. You can use this for support while you get dressed.  Do not have throw rugs and other things on the floor that can make you trip. What can I do in the kitchen?  Clean up any spills right away.  Avoid walking on wet floors.  Keep items that you use a lot in easy-to-reach  places.  If you need to reach something above you, use a strong step stool that has a grab bar.  Keep electrical cords out of the way.  Do not use floor polish or wax that makes floors slippery. If you must use wax, use non-skid floor wax.  Do not have throw rugs and other things on the floor that can make you trip. What can I do with my stairs?  Do not leave any items on the stairs.  Make sure that there are handrails on both sides of the stairs and use them. Fix handrails that are broken or loose. Make sure that handrails are as long as the stairways.  Check any carpeting to make sure that it is firmly attached to the stairs. Fix any carpet that is loose or worn.  Avoid having throw rugs at the top or bottom of the stairs. If you do have throw rugs, attach them to the floor with carpet tape.  Make sure that you have a light switch at the  top of the stairs and the bottom of the stairs. If you do not have them, ask someone to add them for you. What else can I do to help prevent falls?  Wear shoes that: ? Do not have high heels. ? Have rubber bottoms. ? Are comfortable and fit you well. ? Are closed at the toe. Do not wear sandals.  If you use a stepladder: ? Make sure that it is fully opened. Do not climb a closed stepladder. ? Make sure that both sides of the stepladder are locked into place. ? Ask someone to hold it for you, if possible.  Clearly mark and make sure that you can see: ? Any grab bars or handrails. ? First and last steps. ? Where the edge of each step is.  Use tools that help you move around (mobility aids) if they are needed. These include: ? Canes. ? Walkers. ? Scooters. ? Crutches.  Turn on the lights when you go into a dark area. Replace any light bulbs as soon as they burn out.  Set up your furniture so you have a clear path. Avoid moving your furniture around.  If any of your floors are uneven, fix them.  If there are any pets around you, be aware of where they are.  Review your medicines with your doctor. Some medicines can make you feel dizzy. This can increase your chance of falling. Ask your doctor what other things that you can do to help prevent falls. This information is not intended to replace advice given to you by your health care provider. Make sure you discuss any questions you have with your health care provider. Document Released: 05/22/2009 Document Revised: 01/01/2016 Document Reviewed: 08/30/2014 Elsevier Interactive Patient Education  2017 [employer].

## 2020-12-01 NOTE — Progress Notes (Signed)
Subjective:   Michael Clark is a 65 y.o. male who presents for Medicare Annual/Subsequent preventive examination.  Virtual Visit via Telephone Note  I connected with  Michael Clark on 12/01/20 at  1:15 PM EDT by telephone and verified that I am speaking with the correct person using two identifiers.  Location: Patient: HOME Provider: [facility] Persons participating in the virtual visit: patient/Nurse Health Advisor   I discussed the limitations, risks, security and privacy concerns of performing an evaluation and management service by telephone and the availability of in person appointments. The patient expressed understanding and agreed to proceed.  Interactive audio and video telecommunications were attempted between this nurse and patient, however failed, due to patient having technical difficulties OR patient did not have access to video capability.  We continued and completed visit with audio only.  Some vital signs may be absent or patient reported.   Tian Mcmurtrey E Chavela Justiniano, LPN   Review of Systems     Cardiac Risk Factors include: advanced age (>36men, >45 women);male gender;dyslipidemia     Objective:    Today's Vitals   12/01/20 1532  Weight: 215 lb (97.5 kg)   Body mass index is 30.85 kg/m.  Advanced Directives 12/01/2020 12/01/2020 03/13/2020 11/29/2019 12/14/2018 07/16/2011 07/14/2011  Does Patient Have a Medical Advance Directive? Yes Yes Yes Yes No Patient has advance directive, copy not in chart Patient has advance directive, copy not in chart  Type of Advance Directive [place];Living will [place];Living will [place];Living will Living will - Living will Living will  Does patient want to make changes to medical advance directive? - - - No - Patient declined - - -  Copy of [place] in Chart? No - copy requested No - copy requested - - - Copy requested from family -  Would patient like information  on creating a medical advance directive? - - - - No - Patient declined - -  Pre-existing out of facility DNR order (yellow form or pink MOST form) - - - - - No No    Current Medications (verified) Outpatient Encounter Medications as of 12/01/2020  Medication Sig  . albuterol (VENTOLIN HFA) 108 (90 Base) MCG/ACT inhaler Inhale into the lungs.  . cholecalciferol (VITAMIN D) 1000 units tablet Take 2,000 Units by mouth daily.   . fluticasone (FLONASE) 50 MCG/ACT nasal spray Place 2 sprays into both nostrils daily. As needed  . Multiple Vitamin (MULTIVITAMIN WITH MINERALS) TABS tablet Take 1 tablet by mouth daily.  . pantoprazole (PROTONIX) 40 MG tablet TAKE ONE (1) TABLET EACH DAY  . rosuvastatin (CRESTOR) 5 MG tablet Take 1 tablet (5 mg total) by mouth daily.   No facility-administered encounter medications on file as of 12/01/2020.    Allergies (verified) Patient has no known allergies.   History: Past Medical History:  Diagnosis Date  . BPH (benign prostatic hyperplasia)   . GERD (gastroesophageal reflux disease)   . Hyperlipidemia   . Trigger finger, left   . Vitamin D deficiency    Past Surgical History:  Procedure Laterality Date  . APPENDECTOMY    . CARPAL TUNNEL RELEASE  2006   LEFT   . EXTRACORPOREAL SHOCK WAVE LITHOTRIPSY Left 03/13/2020   Procedure: EXTRACORPOREAL SHOCK WAVE LITHOTRIPSY (ESWL);  Surgeon: [name], MD;  Location: [facility];  Service: Urology;  Laterality: Left;  . EYE SURGERY Bilateral    cataracts  . LEFT THUM PULLEY RELEASE  2006--  DONE W/ CARPAL TUNNEL RELEASE  . TRIGGER FINGER RELEASE  07/16/2011   Procedure: RELEASE TRIGGER FINGER/A-1 PULLEY;  Surgeon: [name];  Location: [facility];  Service: Orthopedics;  Laterality: Left;   Family History  Problem Relation Age of Onset  . Alzheimer's disease Mother   . Cancer Father        prostate  . Diabetes Father    Social History   Socioeconomic History   . Marital status: Married    Spouse name: Not on file  . Number of children: 2  . Years of education: 66  . Highest education level: Bachelor's degree (e.g., BA, AB, BS)  Occupational History    Comment: retired  Tobacco Use  . Smoking status: Former Smoker    Quit date: 10/17/2004    Years since quitting: 16.1  . Smokeless tobacco: Former Network engineer  . Vaping Use: Never used  Substance and Sexual Activity  . Alcohol use: Yes    Alcohol/week: 10.0 standard drinks    Types: 10 Cans of beer per week  . Drug use: No  . Sexual activity: Not on file  Other Topics Concern  . Not on file  Social History Narrative   Lives with his wife   Social Determinants of Health   Financial Resource Strain: Low Risk   . Difficulty of Paying Living Expenses: Not hard at all  Food Insecurity: No Food Insecurity  . Worried About Charity fundraiser in the Last Year: Never true  . Ran Out of Food in the Last Year: Never true  Transportation Needs: No Transportation Needs  . Lack of Transportation (Medical): No  . Lack of Transportation (Non-Medical): No  Physical Activity: Insufficiently Active  . Days of Exercise per Week: 3 days  . Minutes of Exercise per Session: 30 min  Stress: No Stress Concern Present  . Feeling of Stress : Not at all  Social Connections: Moderately Integrated  . Frequency of Communication with Friends and Family: More than three times a week  . Frequency of Social Gatherings with Friends and Family: Twice a week  . Attends Religious Services: More than 4 times per year  . Active Member of Clubs or Organizations: No  . Attends Archivist Meetings: Never  . Marital Status: Married    Tobacco Counseling Counseling given: Not Answered   Clinical Intake:  Pre-visit preparation completed: Yes  Pain : No/denies pain     BMI - recorded: 30.85 Nutritional Status: BMI > 30  Obese Nutritional Risks: None Diabetes: No     Diabetic?  No  Interpreter Needed?: No  Information entered by :: Shakeem Stern, LPN   Activities of Daily Living In your present state of health, do you have any difficulty performing the following activities: 12/01/2020 12/01/2020  Hearing? Tempie Donning  Vision? N N  Difficulty concentrating or making decisions? N N  Walking or climbing stairs? N N  Dressing or bathing? N N  Doing errands, shopping? N N  Preparing Food and eating ? N N  Using the Toilet? N N  In the past six months, have you accidently leaked urine? N N  Do you have problems with loss of bowel control? N N  Managing your Medications? N N  Managing your Finances? N N  Housekeeping or managing your Housekeeping? N N  Some recent data might be hidden    Patient Care Team: Dettinger, Fransisca Kaufmann, MD as PCP - General (  Family Medicine) Harriett Sine, MD as Consulting Physician (Dermatology)  Indicate any recent Medical Services you may have received from other than [facility] providers in the past year (date may be approximate).     Assessment:   This is a routine wellness examination for [place].  Hearing/Vision screen  Hearing Screening   125Hz  250Hz  500Hz  1000Hz  2000Hz  3000Hz  4000Hz  6000Hz  8000Hz   Right ear:           Left ear:           Comments: C/o mild hearing loss - has difficulty hearing in crowded areas with lots of background noise - declines hearing aids  Vision Screening Comments: Denies vision difficulties - no eye doctor  Dietary issues and exercise activities discussed: Current Exercise Habits: Home exercise routine, Type of exercise: walking, Time (Minutes): 30, Frequency (Times/Week): 3, Weekly Exercise (Minutes/Week): 90, Intensity: Mild, Exercise limited by: None identified  Goals    . Increase physical activity     Lose weight and get more active and healthier Goals Addressed             This Visit's Progress   . Increase physical activity       Lose weight and get more active and healthier              Depression Screen PHQ 2/9 Scores 12/01/2020 12/01/2020 04/04/2020 11/29/2019 01/17/2019 06/22/2018 04/28/2018  PHQ - 2 Score 0 0 0 0 0 0 0    Fall Risk Fall Risk  12/01/2020 12/01/2020 04/04/2020 11/29/2019 01/17/2019  Falls in the past year? 0 0 0 0 0  Number falls in past yr: 0 0 - - -  Injury with Fall? 0 0 - - -  Risk for fall due to : No Fall Risks No Fall Risks - - -  Follow up Falls prevention discussed Falls prevention discussed - - -    FALL RISK PREVENTION PERTAINING TO THE HOME:  Any stairs in or around the home? Yes  If so, are there any without handrails? No  Home free of loose throw rugs in walkways, pet beds, electrical cords, etc? Yes  Adequate lighting in your home to reduce risk of falls? Yes   ASSISTIVE DEVICES UTILIZED TO PREVENT FALLS:  Life alert? No  Use of a cane, walker or w/c? No  Grab bars in the bathroom? Yes  Shower chair or bench in shower? Yes  Elevated toilet seat or a handicapped toilet? No   TIMED UP AND GO:  Was the test performed? No . Telephonic visit.  Cognitive Function:Normal cognitive status assessed by direct observation by this Nurse Health Advisor. No abnormalities found.       6CIT Screen 11/29/2019  What Year? 0 points  What month? 0 points  What time? 0 points  Count back from 20 0 points  Months in reverse 2 points  Repeat phrase 0 points  Total Score 2    Immunizations Immunization History  Administered Date(s) Administered  . Fluad Quad(high Dose 65+) 07/21/2020  . Influenza Inj Mdck Quad Pf 05/10/2017  . Influenza, High Dose Seasonal PF 07/28/2020  . Influenza,inj,Quad PF,6+ Mos 05/30/2013, 05/27/2015, 06/08/2016, 07/11/2018  . Influenza-Unspecified 05/29/2014, 04/14/2019  . PFIZER(Purple Top)SARS-COV-2 Vaccination 09/03/2019, 09/24/2019, 05/06/2020  . Pneumococcal Conjugate-13 07/21/2020  . Tdap 07/08/2010  . Zoster 04/03/2013    TDAP status: Due, Education has been provided regarding the importance of this  vaccine. Advised may receive this vaccine at local pharmacy or Health Dept. Aware to provide a  copy of the vaccination record if obtained from local pharmacy or Health Dept. Verbalized acceptance and understanding.  Flu Vaccine status: Up to date  Pneumococcal vaccine status: Up to date  Covid-19 vaccine status: Completed vaccines  Qualifies for Shingles Vaccine? Yes   Zostavax completed No   Shingrix Completed?: No.    Education has been provided regarding the importance of this vaccine. Patient has been advised to call insurance company to determine out of pocket expense if they have not yet received this vaccine. Advised may also receive vaccine at local pharmacy or Health Dept. Verbalized acceptance and understanding.  Screening Tests Health Maintenance  Topic Date Due  . COLON CANCER SCREENING ANNUAL FOBT  Never done  . TETANUS/TDAP  07/08/2020  . INFLUENZA VACCINE  03/09/2021  . PNA vac Low Risk Adult (2 of 2 - PPSV23) 07/21/2021  . COLONOSCOPY (Pts 45-33yrs Insurance coverage will need to be confirmed)  10/08/2025  . COVID-19 Vaccine  Completed  . Hepatitis C Screening  Completed  . HPV VACCINES  Aged Out    Health Maintenance  Health Maintenance Due  Topic Date Due  . COLON CANCER SCREENING ANNUAL FOBT  Never done  . TETANUS/TDAP  07/08/2020    Colorectal cancer screening: Type of screening: Colonoscopy. Completed 10/09/2015. Repeat every 10 years  Lung Cancer Screening: (Low Dose CT Chest recommended if Age 65-80 years, 30 pack-year currently smoking OR have quit w/in 15years.) does not qualify.   Additional Screening:  Hepatitis C Screening: does qualify; Completed 04/04/2020  Vision Screening: Recommended annual ophthalmology exams for early detection of glaucoma and other disorders of the eye. Is the patient up to date with their annual eye exam?  No  Who is the provider or what is the name of the office in which the patient attends annual eye exams? N/A If pt is  not established with a provider, would they like to be referred to a provider to establish care? No .   Dental Screening: Recommended annual dental exams for proper oral hygiene  Community Resource Referral / Chronic Care Management: CRR required this visit?  No   CCM required this visit?  No      Plan:     I have personally reviewed and noted the following in the patient's chart:   . Medical and social history . Use of alcohol, tobacco or illicit drugs  . Current medications and supplements . Functional ability and status . Nutritional status . Physical activity . Advanced directives . List of other physicians . Hospitalizations, surgeries, and ER visits in previous 12 months . Vitals . Screenings to include cognitive, depression, and falls . Referrals and appointments  In addition, I have reviewed and discussed with patient certain preventive protocols, quality metrics, and best practice recommendations. A written personalized care plan for preventive services as well as general preventive health recommendations were provided to patient.     Sandrea Hammond, LPN   [id number]   Nurse Notes: None

## 2020-12-01 NOTE — Progress Notes (Deleted)
Subjective:   Michael Clark is a 65 y.o. male who presents for Medicare Annual/Subsequent preventive examination.  Virtual Visit via Telephone Note  I connected with  Michael Clark on 12/01/20 at  1:15 PM EDT by telephone and verified that I am speaking with the correct person using two identifiers.  Location: Patient: Home Provider: [facility] Persons participating in the virtual visit: patient/Nurse Health Advisor   I discussed the limitations, risks, security and privacy concerns of performing an evaluation and management service by telephone and the availability of in person appointments. The patient expressed understanding and agreed to proceed.  Interactive audio and video telecommunications were attempted between this nurse and patient, however failed, due to patient having technical difficulties OR patient did not have access to video capability.  We continued and completed visit with audio only.  Some vital signs may be absent or patient reported.   Michael Clark E Michael Kalp, LPN   Review of Systems     Cardiac Risk Factors include: advanced age (>3men, >21 women);male gender;dyslipidemia     Objective:    Today's Vitals   12/01/20 1311  Weight: 215 lb (97.5 kg)   Body mass index is 30.85 kg/m.  Advanced Directives 12/01/2020 03/13/2020 11/29/2019 12/14/2018 07/16/2011 07/14/2011  Does Patient Have a Medical Advance Directive? Yes Yes Yes No Patient has advance directive, copy not in chart Patient has advance directive, copy not in chart  Type of Advance Directive [place];Living will [place];Living will Living will - Living will Living will  Does patient want to make changes to medical advance directive? - - No - Patient declined - - -  Copy of [place] in Chart? No - copy requested - - - Copy requested from family -  Would patient like information on creating a medical advance directive? - - - No - Patient declined - -   Pre-existing out of facility DNR order (yellow form or pink MOST form) - - - - No No    Current Medications (verified) Outpatient Encounter Medications as of 12/01/2020  Medication Sig  . albuterol (VENTOLIN HFA) 108 (90 Base) MCG/ACT inhaler Inhale into the lungs.  . cholecalciferol (VITAMIN D) 1000 units tablet Take 2,000 Units by mouth daily.   . fluticasone (FLONASE) 50 MCG/ACT nasal spray Place 2 sprays into both nostrils daily. As needed  . Multiple Vitamin (MULTIVITAMIN WITH MINERALS) TABS tablet Take 1 tablet by mouth daily.  . pantoprazole (PROTONIX) 40 MG tablet TAKE ONE (1) TABLET EACH DAY  . rosuvastatin (CRESTOR) 5 MG tablet Take 1 tablet (5 mg total) by mouth daily.   No facility-administered encounter medications on file as of 12/01/2020.    Allergies (verified) Patient has no known allergies.   History: Past Medical History:  Diagnosis Date  . BPH (benign prostatic hyperplasia)   . GERD (gastroesophageal reflux disease)   . Hyperlipidemia   . Trigger finger, left   . Vitamin D deficiency    Past Surgical History:  Procedure Laterality Date  . APPENDECTOMY    . CARPAL TUNNEL RELEASE  2006   LEFT   . EXTRACORPOREAL SHOCK WAVE LITHOTRIPSY Left 03/13/2020   Procedure: EXTRACORPOREAL SHOCK WAVE LITHOTRIPSY (ESWL);  Surgeon: [name], MD;  Location: [facility];  Service: Urology;  Laterality: Left;  . EYE SURGERY Bilateral    cataracts  . LEFT THUM PULLEY RELEASE  2006--  DONE W/ CARPAL TUNNEL RELEASE  . TRIGGER FINGER RELEASE  07/16/2011  Procedure: RELEASE TRIGGER FINGER/A-1 PULLEY;  Surgeon: Illene Labrador [place];  Location: [facility];  Service: Orthopedics;  Laterality: Left;   Family History  Problem Relation Age of Onset  . Alzheimer's disease Mother   . Cancer Father        prostate  . Diabetes Father    Social History   Socioeconomic History  . Marital status: Married    Spouse name: Not on file  . Number of  children: 2  . Years of education: 64  . Highest education level: Bachelor's degree (e.g., BA, AB, BS)  Occupational History    Comment: retired  Tobacco Use  . Smoking status: Former Smoker    Quit date: 10/17/2004    Years since quitting: 16.1  . Smokeless tobacco: Former Clinical biochemist  . Vaping Use: Never used  Substance and Sexual Activity  . Alcohol use: Yes    Alcohol/week: 10.0 standard drinks    Types: 10 Cans of beer per week  . Drug use: No  . Sexual activity: Not on file  Other Topics Concern  . Not on file  Social History Narrative   Lives with his wife   Social Determinants of Health   Financial Resource Strain: Low Risk   . Difficulty of Paying Living Expenses: Not hard at all  Food Insecurity: No Food Insecurity  . Worried About Programme researcher, broadcasting/film/video in the Last Year: Never true  . Ran Out of Food in the Last Year: Never true  Transportation Needs: No Transportation Needs  . Lack of Transportation (Medical): No  . Lack of Transportation (Non-Medical): No  Physical Activity: Insufficiently Active  . Days of Exercise per Week: 3 days  . Minutes of Exercise per Session: 30 min  Stress: No Stress Concern Present  . Feeling of Stress : Not at all  Social Connections: Moderately Integrated  . Frequency of Communication with Friends and Family: More than three times a week  . Frequency of Social Gatherings with Friends and Family: Twice a week  . Attends Religious Services: More than 4 times per year  . Active Member of Clubs or Organizations: No  . Attends Banker Meetings: Never  . Marital Status: Married    Tobacco Counseling Counseling given: Not Answered   Clinical Intake:  Pre-visit preparation completed: Yes  Pain : No/denies pain     BMI - recorded: 30.85 Nutritional Status: BMI > 30  Obese Nutritional Risks: None Diabetes: No  How often do you need to have someone help you when you read instructions, pamphlets, or other  written materials from your doctor or pharmacy?: 1 - Never  Diabetic? No  Interpreter Needed?: No  Information entered by :: Santos Hardwick, LPN   Activities of Daily Living In your present state of health, do you have any difficulty performing the following activities: 12/01/2020 03/13/2020  Hearing? Y N  Vision? N N  Difficulty concentrating or making decisions? N N  Walking or climbing stairs? N N  Dressing or bathing? N N  Doing errands, shopping? N -  Preparing Food and eating ? N -  Using the Toilet? N -  In the past six months, have you accidently leaked urine? N -  Do you have problems with loss of bowel control? N -  Managing your Medications? N -  Managing your Finances? N -  Housekeeping or managing your Housekeeping? N -  Some recent data might be hidden    Patient Care  Team: Dettinger, Fransisca Kaufmann, MD as PCP - General (Family Medicine) Harriett Sine, MD as Consulting Physician (Dermatology)  Indicate any recent Medical Services you may have received from other than [facility] providers in the past year (date may be approximate).     Assessment:   This is a routine wellness examination for [place].  Hearing/Vision screen  Hearing Screening   125Hz  250Hz  500Hz  1000Hz  2000Hz  3000Hz  4000Hz  6000Hz  8000Hz   Right ear:           Left ear:           Comments: C/o mild hearing loss - has difficulty hearing in crowded areas with lots of background noise - declines hearing aids  Vision Screening Comments: Denies vision difficulties - no eye doctor.  Dietary issues and exercise activities discussed: Current Exercise Habits: Home exercise routine, Type of exercise: walking, Time (Minutes): 30, Frequency (Times/Week): 3, Weekly Exercise (Minutes/Week): 90, Intensity: Mild, Exercise limited by: None identified  Goals    . Increase physical activity     Lose weight and get more active and healthier Goals Addressed             This Visit's Progress   . Increase physical  activity       Lose weight and get more active and healthier             Depression Screen PHQ 2/9 Scores 12/01/2020 04/04/2020 11/29/2019 01/17/2019 06/22/2018 04/28/2018 12/13/2017  PHQ - 2 Score 0 0 0 0 0 0 0    Fall Risk Fall Risk  12/01/2020 04/04/2020 11/29/2019 01/17/2019 06/22/2018  Falls in the past year? 0 0 0 0 0  Number falls in past yr: 0 - - - -  Injury with Fall? 0 - - - -  Risk for fall due to : No Fall Risks - - - -  Follow up Falls prevention discussed - - - -    FALL RISK PREVENTION PERTAINING TO THE HOME:  Any stairs in or around the home? Yes  If so, are there any without handrails? No  Home free of loose throw rugs in walkways, pet beds, electrical cords, etc? Yes  Adequate lighting in your home to reduce risk of falls? Yes   ASSISTIVE DEVICES UTILIZED TO PREVENT FALLS:  Life alert? No  Use of a cane, walker or w/c? No  Grab bars in the bathroom? No  Shower chair or bench in shower? Yes  Elevated toilet seat or a handicapped toilet? No   TIMED UP AND GO:  Was the test performed? No . Telephonic visit.  Cognitive Function:Normal cognitive status assessed by direct observation by this Nurse Health Advisor. No abnormalities found.       6CIT Screen 11/29/2019  What Year? 0 points  What month? 0 points  What time? 0 points  Count back from 20 0 points  Months in reverse 2 points  Repeat phrase 0 points  Total Score 2    Immunizations Immunization History  Administered Date(s) Administered  . Fluad Quad(high Dose 65+) 07/21/2020  . Influenza Inj Mdck Quad Pf 05/10/2017  . Influenza, High Dose Seasonal PF 07/28/2020  . Influenza,inj,Quad PF,6+ Mos 05/30/2013, 05/27/2015, 06/08/2016, 07/11/2018  . Influenza-Unspecified 05/29/2014, 04/14/2019  . PFIZER(Purple Top)SARS-COV-2 Vaccination 09/03/2019, 09/24/2019, 05/06/2020  . Pneumococcal Conjugate-13 07/21/2020  . Tdap 07/08/2010  . Zoster 04/03/2013    TDAP status: Due, Education has been  provided regarding the importance of this vaccine. Advised may receive this vaccine at local pharmacy or Health  Dept. Aware to provide a copy of the vaccination record if obtained from local pharmacy or Health Dept. Verbalized acceptance and understanding.  Flu Vaccine status: Up to date  Pneumococcal vaccine status: Up to date  Covid-19 vaccine status: Completed vaccines  Qualifies for Shingles Vaccine? Yes   Zostavax completed Yes   Shingrix Completed?: No.    Education has been provided regarding the importance of this vaccine. Patient has been advised to call insurance company to determine out of pocket expense if they have not yet received this vaccine. Advised may also receive vaccine at local pharmacy or Health Dept. Verbalized acceptance and understanding.  Screening Tests Health Maintenance  Topic Date Due  . COLON CANCER SCREENING ANNUAL FOBT  Never done  . TETANUS/TDAP  07/08/2020  . INFLUENZA VACCINE  03/09/2021  . PNA vac Low Risk Adult (2 of 2 - PPSV23) 07/21/2021  . COLONOSCOPY (Pts 45-24yrs Insurance coverage will need to be confirmed)  10/08/2025  . COVID-19 Vaccine  Completed  . Hepatitis C Screening  Completed  . HPV VACCINES  Aged Out    Health Maintenance  Health Maintenance Due  Topic Date Due  . COLON CANCER SCREENING ANNUAL FOBT  Never done  . TETANUS/TDAP  07/08/2020    Colorectal cancer screening: Type of screening: Colonoscopy. Completed 10/09/2015. Repeat every 10 years  Lung Cancer Screening: (Low Dose CT Chest recommended if Age 69-80 years, 30 pack-year currently smoking OR have quit w/in 15years.) does not qualify.   Additional Screening:  Hepatitis C Screening: does qualify; Completed 04/04/2020  Vision Screening: Recommended annual ophthalmology exams for early detection of glaucoma and other disorders of the eye. Is the patient up to date with their annual eye exam?  No  Who is the provider or what is the name of the office in which the  patient attends annual eye exams? No provider If pt is not established with a provider, would they like to be referred to a provider to establish care? No .   Dental Screening: Recommended annual dental exams for proper oral hygiene  Community Resource Referral / Chronic Care Management: CRR required this visit?  No   CCM required this visit?  No      Plan:     I have personally reviewed and noted the following in the patient's chart:   . Medical and social history . Use of alcohol, tobacco or illicit drugs  . Current medications and supplements . Functional ability and status . Nutritional status . Physical activity . Advanced directives . List of other physicians . Hospitalizations, surgeries, and ER visits in previous 12 months . Vitals . Screenings to include cognitive, depression, and falls . Referrals and appointments  In addition, I have reviewed and discussed with patient certain preventive protocols, quality metrics, and best practice recommendations. A written personalized care plan for preventive services as well as general preventive health recommendations were provided to patient.     Sandrea Hammond, LPN   [id number]/0354   Nurse Notes: None

## 2021-01-13 ENCOUNTER — Other Ambulatory Visit: Payer: Self-pay | Admitting: Family Medicine

## 2021-01-26 NOTE — Progress Notes (Signed)
Erroneous note

## 2021-03-16 ENCOUNTER — Other Ambulatory Visit: Payer: Self-pay | Admitting: Family Medicine

## 2021-03-16 DIAGNOSIS — K219 Gastro-esophageal reflux disease without esophagitis: Secondary | ICD-10-CM

## 2021-04-06 ENCOUNTER — Encounter: Payer: Medicare Other | Admitting: Family Medicine

## 2021-04-23 ENCOUNTER — Ambulatory Visit (INDEPENDENT_AMBULATORY_CARE_PROVIDER_SITE_OTHER): Payer: Medicare Other | Admitting: Family Medicine

## 2021-04-23 ENCOUNTER — Encounter: Payer: Self-pay | Admitting: Family Medicine

## 2021-04-23 ENCOUNTER — Other Ambulatory Visit: Payer: Self-pay

## 2021-04-23 VITALS — BP 115/75 | HR 64 | Ht 70.0 in | Wt 216.0 lb

## 2021-04-23 DIAGNOSIS — E559 Vitamin D deficiency, unspecified: Secondary | ICD-10-CM | POA: Diagnosis not present

## 2021-04-23 DIAGNOSIS — Z23 Encounter for immunization: Secondary | ICD-10-CM

## 2021-04-23 DIAGNOSIS — K219 Gastro-esophageal reflux disease without esophagitis: Secondary | ICD-10-CM

## 2021-04-23 DIAGNOSIS — Z0000 Encounter for general adult medical examination without abnormal findings: Secondary | ICD-10-CM | POA: Diagnosis not present

## 2021-04-23 DIAGNOSIS — Z0001 Encounter for general adult medical examination with abnormal findings: Secondary | ICD-10-CM | POA: Diagnosis not present

## 2021-04-23 DIAGNOSIS — I700 Atherosclerosis of aorta: Secondary | ICD-10-CM

## 2021-04-23 DIAGNOSIS — E7800 Pure hypercholesterolemia, unspecified: Secondary | ICD-10-CM | POA: Diagnosis not present

## 2021-04-23 DIAGNOSIS — N400 Enlarged prostate without lower urinary tract symptoms: Secondary | ICD-10-CM

## 2021-04-23 MED ORDER — PANTOPRAZOLE SODIUM 40 MG PO TBEC: DELAYED_RELEASE_TABLET | ORAL | 3 refills | Status: DC

## 2021-04-23 NOTE — Progress Notes (Signed)
BP 115/75   Pulse 64   Ht _0  (1.778 m)   Wt 216 lb (98 kg)   SpO2 96%   BMI 30.99 kg/m    Subjective:   Patient ID: Michael Clark, male    DOB: Sep 18, 1955, 65 y.o.   MRN: [id number]  HPI: Michael Clark is a 65 y.o. male presenting on 04/23/2021 for Medical Management of Chronic Issues (CPE)   HPI Adult well exam and physical Patient denies any chest pain, shortness of breath, headaches or vision issues, abdominal complaints, diarrhea, nausea, vomiting, or joint issues.  GERD Patient is currently on Protonix.  She denies any major symptoms or abdominal pain or belching or burping. She denies any blood in her stool or lightheadedness or dizziness.   Hyperlipidemia Patient is coming in for recheck of his hyperlipidemia. The patient is currently taking Crestor. They deny any issues with myalgias or history of liver damage from it. They deny any focal numbness or weakness or chest pain.   BPH Patient is coming in for recheck on BPH Symptoms: None Medication: None Last PSA: 1 year ago, normal  Patient is coming in for vitamin D deficiency recheck  Relevant past medical, surgical, family and social history reviewed and updated as indicated. Interim medical history since our last visit reviewed. Allergies and medications reviewed and updated.  Review of Systems  Constitutional:  Negative for chills and fever.  HENT:  Negative for ear pain and tinnitus.   Eyes:  Negative for pain and visual disturbance.  Respiratory:  Negative for cough, shortness of breath and wheezing.   Cardiovascular:  Negative for chest pain, palpitations and leg swelling.  Gastrointestinal:  Negative for abdominal pain, blood in stool, constipation and diarrhea.  Genitourinary:  Negative for dysuria and hematuria.  Musculoskeletal:  Negative for back pain, gait problem and myalgias.  Skin:  Negative for rash.  Neurological:  Negative for dizziness, weakness, light-headedness and headaches.   Psychiatric/Behavioral:  Negative for suicidal ideas.   All other systems reviewed and are negative.  Per HPI unless specifically indicated above   Allergies as of 04/23/2021   No Known Allergies      Medication List        Accurate as of April 23, 2021  9:51 AM. If you have any questions, ask your nurse or doctor.          STOP taking these medications    albuterol 108 (90 Base) MCG/ACT inhaler Commonly known as: VENTOLIN HFA Stopped by: Fransisca Kaufmann Kamrynn Melott, MD   fluticasone 50 MCG/ACT nasal spray Commonly known as: FLONASE Stopped by: Fransisca Kaufmann Annamae Shivley, MD       TAKE these medications    cholecalciferol 1000 units tablet Commonly known as: VITAMIN D Take 2,000 Units by mouth daily.   multivitamin with minerals Tabs tablet Take 1 tablet by mouth daily.   pantoprazole 40 MG tablet Commonly known as: PROTONIX TAKE ONE (1) TABLET EACH DAY   rosuvastatin 5 MG tablet Commonly known as: CRESTOR TAKE ONE (1) TABLET EACH DAY   thiamine 100 MG tablet Commonly known as: Vitamin B-1 Take 100 mg by mouth daily.         Objective:   BP 115/75   Pulse 64   Ht _1  (1.778 m)   Wt 216 lb (98 kg)   SpO2 96%   BMI 30.99 kg/m   Wt Readings from Last 3 Encounters:  04/23/21 216 lb (98 kg)  12/01/20 215 lb (97.5  kg)  12/01/20 215 lb (97.5 kg)    Physical Exam Vitals and nursing note reviewed.  Constitutional:      General: He is not in acute distress.    Appearance: He is well-developed. He is not diaphoretic.  HENT:     Right Ear: External ear normal.     Left Ear: External ear normal.     Nose: Nose normal.     Mouth/Throat:     Pharynx: No oropharyngeal exudate.  Eyes:     General: No scleral icterus.       Right eye: No discharge.     Conjunctiva/sclera: Conjunctivae normal.     Pupils: Pupils are equal, round, and reactive to light.  Neck:     Thyroid: No thyromegaly.  Cardiovascular:     Rate and Rhythm: Normal rate and regular  rhythm.     Heart sounds: Normal heart sounds. No murmur heard. Pulmonary:     Effort: Pulmonary effort is normal. No respiratory distress.     Breath sounds: Normal breath sounds. No wheezing.  Abdominal:     General: Bowel sounds are normal. There is no distension.     Palpations: Abdomen is soft.     Tenderness: There is no abdominal tenderness. There is no guarding or rebound.  Musculoskeletal:        General: Normal range of motion.     Cervical back: Neck supple.  Lymphadenopathy:     Cervical: No cervical adenopathy.  Skin:    General: Skin is warm and dry.     Findings: No rash.  Neurological:     Mental Status: He is alert and oriented to person, place, and time.     Coordination: Coordination normal.  Psychiatric:        Behavior: Behavior normal.      Assessment & Plan:   Problem List Items Addressed This Visit       Cardiovascular and Mediastinum   Aortic atherosclerosis ([place])     Digestive   GERD (gastroesophageal reflux disease)   Relevant Medications   pantoprazole (PROTONIX) 40 MG tablet     Genitourinary   BPH (benign prostatic hyperplasia)     Other   Vitamin D deficiency   Relevant Orders   VITAMIN D 25 Hydroxy (Vit-D Deficiency, Fractures)   Hyperlipidemia   Relevant Orders   CBC with Differential/Platelet   CMP14+EGFR   Lipid panel   PSA, total and free   Other Visit Diagnoses     Well adult exam    -  Primary   Relevant Orders   CBC with Differential/Platelet   CMP14+EGFR   Lipid panel   PSA, total and free   Need for immunization against influenza       Relevant Orders   Flu Vaccine QUAD High Dose(Fluad) (Completed)     Continue current medicine, will wait for blood work results.  Follow up plan: Return in about 1 year (around 04/23/2022), or if symptoms worsen or fail to improve, for Hyperlipidemia and physical.  Counseling provided for all of the vaccine components Orders Placed This Encounter  Procedures   Flu Vaccine  QUAD High Dose(Fluad)   CBC with Differential/Platelet   CMP14+EGFR   Lipid panel   PSA, total and free   VITAMIN D 25 Hydroxy (Vit-D Deficiency, Fractures)    Caryl Pina, MD [facility] 04/23/2021, 9:51 AM

## 2021-04-27 ENCOUNTER — Encounter: Payer: Self-pay | Admitting: Family Medicine

## 2021-04-27 LAB — CMP14+EGFR
ALT: 19 IU/L (ref 0–44)
AST: 21 IU/L (ref 0–40)
Albumin/Globulin Ratio: 2 (ref 1.2–2.2)
Albumin: 4.6 g/dL (ref 3.8–4.8)
Alkaline Phosphatase: 83 IU/L (ref 44–121)
BUN/Creatinine Ratio: 13 (ref 10–24)
BUN: 14 mg/dL (ref 8–27)
Bilirubin Total: 1.1 mg/dL (ref 0.0–1.2)
CO2: 22 mmol/L (ref 20–29)
Calcium: 9.9 mg/dL (ref 8.6–10.2)
Chloride: 104 mmol/L (ref 96–106)
Creatinine, Ser: 1.08 mg/dL (ref 0.76–1.27)
Globulin, Total: 2.3 g/dL (ref 1.5–4.5)
Glucose: 110 mg/dL — ABNORMAL HIGH (ref 65–99)
Potassium: 4.3 mmol/L (ref 3.5–5.2)
Sodium: 141 mmol/L (ref 134–144)
Total Protein: 6.9 g/dL (ref 6.0–8.5)
eGFR: 75 mL/min/{1.73_m2} (ref >59)

## 2021-04-27 LAB — CBC WITH DIFFERENTIAL/PLATELET
Basophils Absolute: 0.1 10*3/uL (ref 0.0–0.2)
Basos: 1 %
EOS (ABSOLUTE): 0.1 10*3/uL (ref 0.0–0.4)
Eos: 1 %
Hematocrit: 45.6 % (ref 37.5–51.0)
Hemoglobin: 15.6 g/dL (ref 13.0–17.7)
Immature Grans (Abs): 0 10*3/uL (ref 0.0–0.1)
Immature Granulocytes: 1 %
Lymphocytes Absolute: 2.4 10*3/uL (ref 0.7–3.1)
Lymphs: 39 %
MCH: 31.1 pg (ref 26.6–33.0)
MCHC: 34.2 g/dL (ref 31.5–35.7)
MCV: 91 fL (ref 79–97)
Monocytes Absolute: 0.7 10*3/uL (ref 0.1–0.9)
Monocytes: 10 %
Neutrophils Absolute: 3.1 10*3/uL (ref 1.4–7.0)
Neutrophils: 48 %
Platelets: 238 10*3/uL (ref 150–450)
RBC: 5.02 x10E6/uL (ref 4.14–5.80)
RDW: 12.5 % (ref 11.6–15.4)
WBC: 6.3 10*3/uL (ref 3.4–10.8)

## 2021-04-27 LAB — LIPID PANEL
Chol/HDL Ratio: 3.5 ratio (ref 0.0–5.0)
Cholesterol, Total: 159 mg/dL (ref 100–199)
HDL: 45 mg/dL (ref >39)
LDL Chol Calc (NIH): 85 mg/dL (ref 0–99)
Triglycerides: 172 mg/dL — ABNORMAL HIGH (ref 0–149)
VLDL Cholesterol Cal: 29 mg/dL (ref 5–40)

## 2021-04-27 LAB — PSA, TOTAL AND FREE
PSA, Free Pct: 27.4 %
PSA, Free: 0.52 ng/mL
Prostate Specific Ag, Serum: 1.9 ng/mL (ref 0.0–4.0)

## 2021-09-07 DIAGNOSIS — M62830 Muscle spasm of back: Secondary | ICD-10-CM | POA: Diagnosis not present

## 2021-09-07 DIAGNOSIS — M9904 Segmental and somatic dysfunction of sacral region: Secondary | ICD-10-CM | POA: Diagnosis not present

## 2021-09-07 DIAGNOSIS — M9903 Segmental and somatic dysfunction of lumbar region: Secondary | ICD-10-CM | POA: Diagnosis not present

## 2021-09-07 DIAGNOSIS — M9905 Segmental and somatic dysfunction of pelvic region: Secondary | ICD-10-CM | POA: Diagnosis not present

## 2021-09-08 DIAGNOSIS — M9905 Segmental and somatic dysfunction of pelvic region: Secondary | ICD-10-CM | POA: Diagnosis not present

## 2021-09-08 DIAGNOSIS — M9903 Segmental and somatic dysfunction of lumbar region: Secondary | ICD-10-CM | POA: Diagnosis not present

## 2021-09-08 DIAGNOSIS — M62830 Muscle spasm of back: Secondary | ICD-10-CM | POA: Diagnosis not present

## 2021-09-08 DIAGNOSIS — M9904 Segmental and somatic dysfunction of sacral region: Secondary | ICD-10-CM | POA: Diagnosis not present

## 2021-09-09 DIAGNOSIS — M545 Low back pain, unspecified: Secondary | ICD-10-CM | POA: Diagnosis not present

## 2021-09-09 DIAGNOSIS — G8929 Other chronic pain: Secondary | ICD-10-CM | POA: Diagnosis not present

## 2021-09-09 DIAGNOSIS — R030 Elevated blood-pressure reading, without diagnosis of hypertension: Secondary | ICD-10-CM | POA: Diagnosis not present

## 2021-10-16 ENCOUNTER — Other Ambulatory Visit: Payer: Self-pay | Admitting: Family Medicine

## 2021-10-16 NOTE — Telephone Encounter (Signed)
OV 04/23/21 rtc 1 yr ?

## 2021-10-19 ENCOUNTER — Telehealth: Payer: Self-pay | Admitting: Family Medicine

## 2021-10-19 NOTE — Telephone Encounter (Signed)
Patient is due for Pneumovax, Tdap, and could get Shingrix.  Spoke with patient and he scheduled appointment with triage on 10/21/21 to get Pneumovax and Tdap.  Will schedule another date for Shingrix. ? ?

## 2021-10-19 NOTE — Telephone Encounter (Signed)
Patient wants to check on the vaccinations that he is due for. Please call back.  ?

## 2021-10-21 ENCOUNTER — Ambulatory Visit (INDEPENDENT_AMBULATORY_CARE_PROVIDER_SITE_OTHER): Payer: Medicare Other

## 2021-10-21 DIAGNOSIS — Z23 Encounter for immunization: Secondary | ICD-10-CM

## 2021-11-25 DIAGNOSIS — L853 Xerosis cutis: Secondary | ICD-10-CM | POA: Diagnosis not present

## 2021-11-25 DIAGNOSIS — D1801 Hemangioma of skin and subcutaneous tissue: Secondary | ICD-10-CM | POA: Diagnosis not present

## 2021-11-25 DIAGNOSIS — Z85828 Personal history of other malignant neoplasm of skin: Secondary | ICD-10-CM | POA: Diagnosis not present

## 2021-11-25 DIAGNOSIS — L821 Other seborrheic keratosis: Secondary | ICD-10-CM | POA: Diagnosis not present

## 2021-11-25 DIAGNOSIS — L570 Actinic keratosis: Secondary | ICD-10-CM | POA: Diagnosis not present

## 2021-11-30 ENCOUNTER — Encounter: Payer: Self-pay | Admitting: Family Medicine

## 2021-12-02 ENCOUNTER — Ambulatory Visit (INDEPENDENT_AMBULATORY_CARE_PROVIDER_SITE_OTHER): Payer: Medicare Other

## 2021-12-02 VITALS — Wt 216.0 lb

## 2021-12-02 DIAGNOSIS — M545 Low back pain, unspecified: Secondary | ICD-10-CM | POA: Insufficient documentation

## 2021-12-02 DIAGNOSIS — G8929 Other chronic pain: Secondary | ICD-10-CM | POA: Insufficient documentation

## 2021-12-02 DIAGNOSIS — Z0000 Encounter for general adult medical examination without abnormal findings: Secondary | ICD-10-CM | POA: Diagnosis not present

## 2021-12-02 NOTE — Patient Instructions (Signed)
Michael Clark , ?Thank you for taking time to come for your Medicare Wellness Visit. I appreciate your ongoing commitment to your health goals. Please review the following plan we discussed and let me know if I can assist you in the future.  ? ?Screening recommendations/referrals: ?Colonoscopy: Done 10/09/2015 - Repeat in 10 years ?Recommended yearly ophthalmology/optometry visit for glaucoma screening and checkup ?Recommended yearly dental visit for hygiene and checkup ? ?Vaccinations: ?Influenza vaccine: Done 04/23/2021 - Repeat annually ?Pneumococcal vaccine: Done 07/21/2020 & 10/21/2021 ?Tdap vaccine: Done 10/21/2021 - Repeat in 10 years ?Shingles vaccine: Zostavax done 2014 - due for new vaccine - Shingrix is 2 doses 2-6 months apart and over 90% effective     ?Covid-19: Done 09/03/2019, 09/24/2019, 05/06/2020, & 07/24/2021 ? ?Advanced directives: Please bring a copy of your health care power of attorney and living will to the office to be added to your chart at your convenience.  ? ?Conditions/risks identified: Aim for 30 minutes of exercise or brisk walking, 6-8 glasses of water, and 5 servings of fruits and vegetables each day.  ? ?Next appointment: Follow up in one year for your annual wellness visit.  ? ?Preventive Care 21 Years and Older, Male ? ?Preventive care refers to lifestyle choices and visits with your health care provider that can promote health and wellness. ?What does preventive care include? ?A yearly physical exam. This is also called an annual well check. ?Dental exams once or twice a year. ?Routine eye exams. Ask your health care provider how often you should have your eyes checked. ?Personal lifestyle choices, including: ?Daily care of your teeth and gums. ?Regular physical activity. ?Eating a healthy diet. ?Avoiding tobacco and drug use. ?Limiting alcohol use. ?Practicing safe sex. ?Taking low doses of aspirin every day. ?Taking vitamin and mineral supplements as recommended by your health care  provider. ?What happens during an annual well check? ?The services and screenings done by your health care provider during your annual well check will depend on your age, overall health, lifestyle risk factors, and family history of disease. ?Counseling  ?Your health care provider may ask you questions about your: ?Alcohol use. ?Tobacco use. ?Drug use. ?Emotional well-being. ?Home and relationship well-being. ?Sexual activity. ?Eating habits. ?History of falls. ?Memory and ability to understand (cognition). ?Work and work Statistician. ?Screening  ?You may have the following tests or measurements: ?Height, weight, and BMI. ?Blood pressure. ?Lipid and cholesterol levels. These may be checked every 5 years, or more frequently if you are over 29 years old. ?Skin check. ?Lung cancer screening. You may have this screening every year starting at age 55 if you have a 30-pack-year history of smoking and currently smoke or have quit within the past 15 years. ?Fecal occult blood test (FOBT) of the stool. You may have this test every year starting at age 45. ?Flexible sigmoidoscopy or colonoscopy. You may have a sigmoidoscopy every 5 years or a colonoscopy every 10 years starting at age 53. ?Prostate cancer screening. Recommendations will vary depending on your family history and other risks. ?Hepatitis C blood test. ?Hepatitis B blood test. ?Sexually transmitted disease (STD) testing. ?Diabetes screening. This is done by checking your blood sugar (glucose) after you have not eaten for a while (fasting). You may have this done every 1-3 years. ?Abdominal aortic aneurysm (AAA) screening. You may need this if you are a current or former smoker. ?Osteoporosis. You may be screened starting at age 62 if you are at high risk. ?Talk with your health care provider  about your test results, treatment options, and if necessary, the need for more tests. ?Vaccines  ?Your health care provider may recommend certain vaccines, such  as: ?Influenza vaccine. This is recommended every year. ?Tetanus, diphtheria, and acellular pertussis (Tdap, Td) vaccine. You may need a Td booster every 10 years. ?Zoster vaccine. You may need this after age 40. ?Pneumococcal 13-valent conjugate (PCV13) vaccine. One dose is recommended after age 52. ?Pneumococcal polysaccharide (PPSV23) vaccine. One dose is recommended after age 42. ?Talk to your health care provider about which screenings and vaccines you need and how often you need them. ?This information is not intended to replace advice given to you by your health care provider. Make sure you discuss any questions you have with your health care provider. ?Document Released: 08/22/2015 Document Revised: 04/14/2016 Document Reviewed: 05/27/2015 ?Elsevier Interactive Patient Education ? 2017 [place]. ? ?Fall Prevention in the Home ?Falls can cause injuries. They can happen to people of all ages. There are many things you can do to make your home safe and to help prevent falls. ?What can I do on the outside of my home? ?Regularly fix the edges of walkways and driveways and fix any cracks. ?Remove anything that might make you trip as you walk through a door, such as a raised step or threshold. ?Trim any bushes or trees on the path to your home. ?Use bright outdoor lighting. ?Clear any walking paths of anything that might make someone trip, such as rocks or tools. ?Regularly check to see if handrails are loose or broken. Make sure that both sides of any steps have handrails. ?Any raised decks and porches should have guardrails on the edges. ?Have any leaves, snow, or ice cleared regularly. ?Use sand or salt on walking paths during winter. ?Clean up any spills in your garage right away. This includes oil or grease spills. ?What can I do in the bathroom? ?Use night lights. ?Install grab bars by the toilet and in the tub and shower. Do not use towel bars as grab bars. ?Use non-skid mats or decals in the tub or  shower. ?If you need to sit down in the shower, use a plastic, non-slip stool. ?Keep the floor dry. Clean up any water that spills on the floor as soon as it happens. ?Remove soap buildup in the tub or shower regularly. ?Attach bath mats securely with double-sided non-slip rug tape. ?Do not have throw rugs and other things on the floor that can make you trip. ?What can I do in the bedroom? ?Use night lights. ?Make sure that you have a light by your bed that is easy to reach. ?Do not use any sheets or blankets that are too big for your bed. They should not hang down onto the floor. ?Have a firm chair that has side arms. You can use this for support while you get dressed. ?Do not have throw rugs and other things on the floor that can make you trip. ?What can I do in the kitchen? ?Clean up any spills right away. ?Avoid walking on wet floors. ?Keep items that you use a lot in easy-to-reach places. ?If you need to reach something above you, use a strong step stool that has a grab bar. ?Keep electrical cords out of the way. ?Do not use floor polish or wax that makes floors slippery. If you must use wax, use non-skid floor wax. ?Do not have throw rugs and other things on the floor that can make you trip. ?What can I do  with my stairs? ?Do not leave any items on the stairs. ?Make sure that there are handrails on both sides of the stairs and use them. Fix handrails that are broken or loose. Make sure that handrails are as long as the stairways. ?Check any carpeting to make sure that it is firmly attached to the stairs. Fix any carpet that is loose or worn. ?Avoid having throw rugs at the top or bottom of the stairs. If you do have throw rugs, attach them to the floor with carpet tape. ?Make sure that you have a light switch at the top of the stairs and the bottom of the stairs. If you do not have them, ask someone to add them for you. ?What else can I do to help prevent falls? ?Wear shoes that: ?Do not have high heels. ?Have  rubber bottoms. ?Are comfortable and fit you well. ?Are closed at the toe. Do not wear sandals. ?If you use a stepladder: ?Make sure that it is fully opened. Do not climb a closed stepladder. ?Make sure that both sides

## 2021-12-02 NOTE — Progress Notes (Signed)
? ?Subjective:  ? Michael Clark is a 66 y.o. male who presents for Medicare Annual/Subsequent preventive examination. ? ?Virtual Visit via Telephone Note ? ?I connected with  [name] on 12/02/21 at 11:15 AM EDT by telephone and verified that I am speaking with the correct person using two identifiers. ? ?Location: ?Patient: Home ?Provider: [facility] ?Persons participating in the virtual visit: patient/Nurse Health Advisor ?  ?I discussed the limitations, risks, security and privacy concerns of performing an evaluation and management service by telephone and the availability of in person appointments. The patient expressed understanding and agreed to proceed. ? ?Interactive audio and video telecommunications were attempted between this nurse and patient, however failed, due to patient having technical difficulties OR patient did not have access to video capability.  We continued and completed visit with audio only. ? ?Some vital signs may be absent or patient reported.  ? ?[name], LPN  ? ?Review of Systems    ? ?Cardiac Risk Factors include: advanced age (>33mn, >>5women);male gender;dyslipidemia;obesity (BMI >30kg/m2) ? ?   ?Objective:  ?  ?Today's Vitals  ? 12/02/21 1109  ?Weight: 216 lb (98 kg)  ? ?Body mass index is 30.99 kg/m?. ? ? ?  12/02/2021  ? 11:17 AM 12/01/2020  ?  3:35 PM 12/01/2020  ?  1:43 PM 03/13/2020  ?  7:16 AM 11/29/2019  ?  2:38 PM 12/14/2018  ?  5:35 AM 07/16/2011  ?  6:24 AM  ?Advanced Directives  ?Does Patient Have a Medical Advance Directive? Yes Yes Yes Yes Yes No Patient has advance directive, copy not in chart  ?Type of AParamed[place]of [place]iverdaleLiving will [place]anaLiving will HBell CenterLiving will HPaw Paw LakeLiving will Living will  Living will  ?Does patient want to make changes to medical advance directive?     No - Patient declined    ?Copy of [place] Chart? No - copy requested  No - copy requested No - copy requested    Copy requested from family  ?Would patient like information on creating a medical advance directive?      No - Patient declined   ?Pre-existing out of facility DNR order (yellow form or pink MOST form)       No  ? ? ?Current Medications (verified) ?Outpatient Encounter Medications as of 12/02/2021  ?Medication Sig  ? cholecalciferol (VITAMIN D) 1000 units tablet Take 2,000 Units by mouth daily.   ? cyanocobalamin 1000 MCG tablet Take 1,000 mcg by mouth daily.  ? Multiple Vitamin (MULTIVITAMIN WITH MINERALS) TABS tablet Take 1 tablet by mouth daily.  ? pantoprazole (PROTONIX) 40 MG tablet TAKE ONE (1) TABLET EACH DAY  ? rosuvastatin (CRESTOR) 5 MG tablet TAKE ONE (1) TABLET EACH DAY  ? [DISCONTINUED] fluorouracil (EFUDEX) 5 % cream Apply topically.  ? [DISCONTINUED] thiamine (VITAMIN B-1) 100 MG tablet Take 100 mg by mouth daily.  ? ?No facility-administered encounter medications on file as of 12/02/2021.  ? ? ?Allergies (verified) ?Patient has no known allergies.  ? ?History: ?Past Medical History:  ?Diagnosis Date  ? BPH (benign prostatic hyperplasia)   ? GERD (gastroesophageal reflux disease)   ? Hyperlipidemia   ? Trigger finger, left   ? Vitamin D deficiency   ? ?Past Surgical History:  ?Procedure Laterality Date  ? APPENDECTOMY    ? CARPAL TUNNEL RELEASE  2006  ? LEFT   ? EXTRACORPOREAL SHOCK WAVE LITHOTRIPSY Left 03/13/2020  ?  Procedure: EXTRACORPOREAL SHOCK WAVE LITHOTRIPSY (ESWL);  Surgeon: [name], MD;  Location: [facility];  Service: Urology;  Laterality: Left;  ? EYE SURGERY Bilateral   ? cataracts  ? LEFT THUM PULLEY RELEASE  2006--  DONE W/ CARPAL TUNNEL RELEASE  ? TRIGGER FINGER RELEASE  07/16/2011  ? Procedure: RELEASE TRIGGER FINGER/A-1 PULLEY;  Surgeon: [name];  Location: [facility];  Service: Orthopedics;  Laterality: Left;  ? ?Family History  ?Problem Relation Age of Onset  ? Alzheimer's disease Mother   ? Cancer  Father   ?     prostate  ? Diabetes Father   ? ?Social History  ? ?Socioeconomic History  ? Marital status: Married  ?  Spouse name: Not on file  ? Number of children: 2  ? Years of education: 31  ? Highest education level: Bachelor's degree (e.g., BA, AB, BS)  ?Occupational History  ?  Comment: retired  ?Tobacco Use  ? Smoking status: Former  ?  Types: Cigarettes  ?  Quit date: 10/17/2004  ?  Years since quitting: 17.1  ? Smokeless tobacco: Former  ?Vaping Use  ? Vaping Use: Never used  ?Substance and Sexual Activity  ? Alcohol use: Yes  ?  Alcohol/week: 10.0 standard drinks  ?  Types: 10 Cans of beer per week  ? Drug use: No  ? Sexual activity: Not on file  ?Other Topics Concern  ? Not on file  ?Social History Narrative  ? Lives with his wife  ? ?Social Determinants of Health  ? ?Financial Resource Strain: Low Risk   ? Difficulty of Paying Living Expenses: Not hard at all  ?Food Insecurity: No Food Insecurity  ? Worried About Charity fundraiser in the Last Year: Never true  ? Ran Out of Food in the Last Year: Never true  ?Transportation Needs: No Transportation Needs  ? Lack of Transportation (Medical): No  ? Lack of Transportation (Non-Medical): No  ?Physical Activity: Sufficiently Active  ? Days of Exercise per Week: 3 days  ? Minutes of Exercise per Session: 60 min  ?Stress: No Stress Concern Present  ? Feeling of Stress : Not at all  ?Social Connections: Socially Integrated  ? Frequency of Communication with Friends and Family: More than three times a week  ? Frequency of Social Gatherings with Friends and Family: More than three times a week  ? Attends Religious Services: More than 4 times per year  ? Active Member of Clubs or Organizations: Yes  ? Attends Archivist Meetings: More than 4 times per year  ? Marital Status: Married  ? ? ?Tobacco Counseling ?Counseling given: Not Answered ? ? ?Clinical Intake: ? ?Pre-visit preparation completed: Yes ? ?Pain : No/denies pain ? ?  ? ?BMI - recorded:  30.99 ?Nutritional Status: BMI > 30  Obese ?Nutritional Risks: None ?Diabetes: No ? ?How often do you need to have someone help you when you read instructions, pamphlets, or other written materials from your doctor or pharmacy?: 1 - Never ? ?Diabetic? no ? ?Interpreter Needed?: No ? ?Information entered by :: Grayer Sproles, LPN ? ? ?Activities of Daily Living ? ?  12/02/2021  ? 11:18 AM  ?In your present state of health, do you have any difficulty performing the following activities:  ?Hearing? 1  ?Comment mild  ?Vision? 0  ?Difficulty concentrating or making decisions? 0  ?Walking or climbing stairs? 0  ?Dressing or bathing? 0  ?Doing errands, shopping? 0  ?  Preparing Food and eating ? N  ?Using the Toilet? N  ?In the past six months, have you accidently leaked urine? N  ?Do you have problems with loss of bowel control? N  ?Managing your Medications? N  ?Managing your Finances? N  ?Housekeeping or managing your Housekeeping? N  ? ? ?Patient Care Team: ?Dettinger, Fransisca Kaufmann, MD as PCP - General (Family Medicine) ?Harriett Sine, MD as Consulting Physician (Dermatology) ?Consuella Lose, MD as Consulting Physician (Neurosurgery) ? ?Indicate any recent Medical Services you may have received from other than [facility] providers in the past year (date may be approximate). ? ?   ?Assessment:  ? This is a routine wellness examination for Bransyn. ? ?Hearing/Vision screen ?Hearing Screening - Comments:: C/o mild hearing difficulties - declines hearing aids  ?Vision Screening - Comments:: Denies vision difficulties - up to date with routine eye exams with Dr Lucita Ferrara  ? ?Dietary issues and exercise activities discussed: ?Current Exercise Habits: Home exercise routine, Type of exercise: walking;Other - see comments (golfing), Time (Minutes): 60, Frequency (Times/Week): 3, Weekly Exercise (Minutes/Week): 180, Intensity: Mild, Exercise limited by: None identified ? ? Goals Addressed   ? ?  ?  ?  ?  ? This Visit's Progress  ?   Increase physical activity   On track  ?  Lose weight and get more active and healthier ?Goals Addressed   ? ?  ?  ?  ?  ? This Visit's Progress  ?  Increase physical activity     ?  Lose weight and get more

## 2022-02-02 ENCOUNTER — Telehealth: Payer: Self-pay | Admitting: Family Medicine

## 2022-02-02 DIAGNOSIS — E7800 Pure hypercholesterolemia, unspecified: Secondary | ICD-10-CM

## 2022-02-02 DIAGNOSIS — K219 Gastro-esophageal reflux disease without esophagitis: Secondary | ICD-10-CM

## 2022-02-02 DIAGNOSIS — N400 Enlarged prostate without lower urinary tract symptoms: Secondary | ICD-10-CM

## 2022-02-15 NOTE — Telephone Encounter (Signed)
Placed future lab orders for the patient

## 2022-03-16 ENCOUNTER — Encounter: Payer: Self-pay | Admitting: Family Medicine

## 2022-03-18 ENCOUNTER — Encounter: Payer: Self-pay | Admitting: Family Medicine

## 2022-03-18 ENCOUNTER — Ambulatory Visit (INDEPENDENT_AMBULATORY_CARE_PROVIDER_SITE_OTHER): Payer: Medicare Other | Admitting: Family Medicine

## 2022-03-18 ENCOUNTER — Ambulatory Visit: Payer: Medicare Other | Admitting: Family Medicine

## 2022-03-18 ENCOUNTER — Telehealth: Payer: Self-pay | Admitting: *Deleted

## 2022-03-18 VITALS — BP 120/75 | HR 88 | Temp 98.0°F | Ht 70.0 in | Wt 225.0 lb

## 2022-03-18 DIAGNOSIS — Z6832 Body mass index (BMI) 32.0-32.9, adult: Secondary | ICD-10-CM

## 2022-03-18 DIAGNOSIS — E669 Obesity, unspecified: Secondary | ICD-10-CM

## 2022-03-18 MED ORDER — SEMAGLUTIDE-WEIGHT MANAGEMENT 0.25 MG/0.5ML ~~LOC~~ SOAJ: 0.2500 mg | SUBCUTANEOUS | 0 refills | Status: AC

## 2022-03-18 MED ORDER — SEMAGLUTIDE-WEIGHT MANAGEMENT 0.5 MG/0.5ML ~~LOC~~ SOAJ: 0.5000 mg | SUBCUTANEOUS | 0 refills | Status: DC

## 2022-03-18 MED ORDER — SEMAGLUTIDE-WEIGHT MANAGEMENT 1 MG/0.5ML ~~LOC~~ SOAJ
1.0000 mg | SUBCUTANEOUS | 0 refills | Status: DC
Start: 2022-05-15 — End: 2022-05-03

## 2022-03-18 MED ORDER — SEMAGLUTIDE-WEIGHT MANAGEMENT 1.7 MG/0.75ML ~~LOC~~ SOAJ: 1.7000 mg | SUBCUTANEOUS | 0 refills | Status: DC

## 2022-03-18 MED ORDER — SEMAGLUTIDE-WEIGHT MANAGEMENT 2.4 MG/0.75ML ~~LOC~~ SOAJ: 2.4000 mg | SUBCUTANEOUS | 0 refills | Status: DC

## 2022-03-18 NOTE — Telephone Encounter (Signed)
PA for Wegovy-In Process  Key: [id number]  PA ID- [id number]

## 2022-03-18 NOTE — Progress Notes (Signed)
BP 120/75   Pulse 88   Temp 98 F (36.7 C)   Ht '5\' 10"'$  (1.778 m)   Wt 225 lb (102.1 kg)   SpO2 97%   BMI 32.28 kg/m    Subjective:   Patient ID: Michael Clark, male    DOB: 1955/08/19, 66 y.o.   MRN: [id number]  HPI: Michael Clark is a 66 y.o. male presenting on 03/18/2022 for Obesity (Would like a medication to help him lose weight)   HPI Obesity and wants to discuss weight loss Patient has been having trouble with weight gain and weight recently and wants to do something for weight loss.  He has heard about Wegovy and would like to try that.  If it is covered by his insurance.  He says he has tried some different diets and they just have not worked as well.  Because of his weight he is starting to have more reflux and sometimes he feels like he has to catch his breath because of the increased weight.  Relevant past medical, surgical, family and social history reviewed and updated as indicated. Interim medical history since our last visit reviewed. Allergies and medications reviewed and updated.  Review of Systems  Constitutional:  Positive for unexpected weight change. Negative for appetite change, chills and fever.  Eyes:  Negative for visual disturbance.  Respiratory:  Negative for shortness of breath and wheezing.   Cardiovascular:  Negative for chest pain and leg swelling.  Gastrointestinal:  Negative for abdominal pain, constipation and diarrhea.  Musculoskeletal:  Negative for back pain and gait problem.  Skin:  Negative for rash.  Neurological:  Negative for dizziness.  All other systems reviewed and are negative.   Per HPI unless specifically indicated above   Allergies as of 03/18/2022   No Known Allergies      Medication List        Accurate as of March 18, 2022  4:51 PM. If you have any questions, ask your nurse or doctor.          cholecalciferol 1000 units tablet Commonly known as: VITAMIN D Take 2,000 Units by mouth daily.    cyanocobalamin 1000 MCG tablet Take 1,000 mcg by mouth daily.   multivitamin with minerals Tabs tablet Take 1 tablet by mouth daily.   pantoprazole 40 MG tablet Commonly known as: PROTONIX TAKE ONE (1) TABLET EACH DAY   rosuvastatin 5 MG tablet Commonly known as: CRESTOR TAKE ONE (1) TABLET EACH DAY   Semaglutide-Weight Management 0.25 MG/0.5ML Soaj Inject 0.25 mg into the skin once a week for 28 days. Started by: Worthy Rancher, MD   Semaglutide-Weight Management 0.5 MG/0.5ML Soaj Inject 0.5 mg into the skin once a week for 28 days. Start taking on: April 16, 2022 Started by: Fransisca Kaufmann Altair Stanko, MD   Semaglutide-Weight Management 1 MG/0.5ML Soaj Inject 1 mg into the skin once a week for 28 days. Start taking on: May 15, 2022 Started by: Fransisca Kaufmann Marrion Accomando, MD   Semaglutide-Weight Management 1.7 MG/0.75ML Soaj Inject 1.7 mg into the skin once a week for 28 days. Start taking on: June 13, 2022 Started by: Fransisca Kaufmann Cathy Crounse, MD   Semaglutide-Weight Management 2.4 MG/0.75ML Soaj Inject 2.4 mg into the skin once a week for 28 days. Start taking on: July 12, 2022 Started by: Fransisca Kaufmann Sheral Pfahler, MD         Objective:   BP 120/75   Pulse 88   Temp 98 F (36.7 C)  Ht '5\' 10"'$  (1.778 m)   Wt 225 lb (102.1 kg)   SpO2 97%   BMI 32.28 kg/m   Wt Readings from Last 3 Encounters:  03/18/22 225 lb (102.1 kg)  12/02/21 216 lb (98 kg)  04/23/21 216 lb (98 kg)    Physical Exam Vitals and nursing note reviewed.  Constitutional:      General: He is not in acute distress.    Appearance: He is well-developed. He is not diaphoretic.  Eyes:     General: No scleral icterus.    Conjunctiva/sclera: Conjunctivae normal.  Neck:     Thyroid: No thyromegaly.  Cardiovascular:     Rate and Rhythm: Normal rate and regular rhythm.     Heart sounds: Normal heart sounds. No murmur heard. Pulmonary:     Effort: Pulmonary effort is normal. No respiratory distress.      Breath sounds: Normal breath sounds. No wheezing.  Musculoskeletal:        General: Normal range of motion.     Cervical back: Neck supple.  Lymphadenopathy:     Cervical: No cervical adenopathy.  Skin:    General: Skin is warm and dry.     Findings: No rash.  Neurological:     Mental Status: He is alert and oriented to person, place, and time.     Coordination: Coordination normal.  Psychiatric:        Behavior: Behavior normal.       Assessment & Plan:   Problem List Items Addressed This Visit   None Visit Diagnoses     Obesity (BMI 30.0-34.9)    -  Primary   Relevant Medications   Semaglutide-Weight Management 0.25 MG/0.5ML SOAJ   Semaglutide-Weight Management 0.5 MG/0.5ML SOAJ (Start on 04/16/2022)   Semaglutide-Weight Management 1 MG/0.5ML SOAJ (Start on 05/15/2022)   Semaglutide-Weight Management 1.7 MG/0.75ML SOAJ (Start on 06/13/2022)   Semaglutide-Weight Management 2.4 MG/0.75ML SOAJ (Start on 07/12/2022)       We will try for [facility], patient has already been trying to change his diet and exercise, he has been golfing he is even joined a gym and is just not able to lose weight and is actually still been gaining weight and he is just getting concerned about that.  He says this is the highest weight he has been in his life.  He also has complaints of having more acid and reflux pressure because of his weight in his abdomen and the reflux. Follow up plan: Return if symptoms worsen or fail to improve, for 1-82-monthweight recheck.  Counseling provided for all of the vaccine components No orders of the defined types were placed in this encounter.   JCaryl Pina MD [facility] 03/18/2022, 4:51 PM

## 2022-03-19 NOTE — Telephone Encounter (Signed)
PA Response - Denied  You asked if Wegovy Inj 0.'25mg'$  is covered for you.  Under section 1860D-2(e)(2) of the Social Security Act (the Act), certain drugs are excluded from Medicare coverage or are excluded from coverage when used to treat certain medical conditions.  Wegovy Inj 0.'25mg'$  is one of the drugs that are excluded from Medicare coverage by law, and we do not offer the drug as a supplemental benefit

## 2022-03-22 MED ORDER — SAXENDA 18 MG/3ML ~~LOC~~ SOPN: PEN_INJECTOR | SUBCUTANEOUS | 0 refills | Status: DC

## 2022-03-22 NOTE — Telephone Encounter (Signed)
Already sent in, I sent to try because I do not know if it is going to be covered

## 2022-03-22 NOTE — Telephone Encounter (Signed)
lmtcb

## 2022-03-22 NOTE — Telephone Encounter (Signed)
Try to send in [place] for him to see if that will be covered

## 2022-03-22 NOTE — Telephone Encounter (Signed)
Please send in dose

## 2022-03-23 NOTE — Telephone Encounter (Signed)
Saxenda DENIED as well.   Medicare will not pay for weight Loss medication.  Michael Clark indicates that phentermine is patients only option

## 2022-03-23 NOTE — Telephone Encounter (Signed)
Michael Clark (Key: [id number]) Rx #: [id number] Saxenda '18MG'$ Fayne Mediate pen-injectors  Sent to plan

## 2022-03-24 NOTE — Telephone Encounter (Signed)
Pt aware insurance has denied both wegovy and saxenda and aware of provider feedback but doesn't want to do another visit or discuss phentermine at this time.

## 2022-03-24 NOTE — Telephone Encounter (Signed)
I do not know if we discussed phentermine the pill in person, I believe we mentioned it and positives and negatives and side effects but it looks like it would be likely the only option for him, I would like to discuss further, is something we can discuss at the next visit or he can come back sooner and we can discuss the positives and negatives of phentermine and likely get an EKG before starting.

## 2022-04-06 ENCOUNTER — Other Ambulatory Visit: Payer: Self-pay | Admitting: Family Medicine

## 2022-04-27 ENCOUNTER — Other Ambulatory Visit: Payer: Medicare Other

## 2022-04-27 DIAGNOSIS — Z0000 Encounter for general adult medical examination without abnormal findings: Secondary | ICD-10-CM

## 2022-04-27 DIAGNOSIS — I700 Atherosclerosis of aorta: Secondary | ICD-10-CM | POA: Diagnosis not present

## 2022-04-27 DIAGNOSIS — E785 Hyperlipidemia, unspecified: Secondary | ICD-10-CM | POA: Diagnosis not present

## 2022-04-27 DIAGNOSIS — K219 Gastro-esophageal reflux disease without esophagitis: Secondary | ICD-10-CM | POA: Diagnosis not present

## 2022-04-28 LAB — CBC WITH DIFFERENTIAL/PLATELET
Basophils Absolute: 0.1 10*3/uL (ref 0.0–0.2)
Basos: 1 %
EOS (ABSOLUTE): 0.1 10*3/uL (ref 0.0–0.4)
Eos: 1 %
Hematocrit: 45.8 % (ref 37.5–51.0)
Hemoglobin: 15.6 g/dL (ref 13.0–17.7)
Immature Grans (Abs): 0 10*3/uL (ref 0.0–0.1)
Immature Granulocytes: 1 %
Lymphocytes Absolute: 3.6 10*3/uL — ABNORMAL HIGH (ref 0.7–3.1)
Lymphs: 49 %
MCH: 30.7 pg (ref 26.6–33.0)
MCHC: 34.1 g/dL (ref 31.5–35.7)
MCV: 90 fL (ref 79–97)
Monocytes Absolute: 0.5 10*3/uL (ref 0.1–0.9)
Monocytes: 7 %
Neutrophils Absolute: 3 10*3/uL (ref 1.4–7.0)
Neutrophils: 41 %
Platelets: 213 10*3/uL (ref 150–450)
RBC: 5.08 x10E6/uL (ref 4.14–5.80)
RDW: 11.9 % (ref 11.6–15.4)
WBC: 7.3 10*3/uL (ref 3.4–10.8)

## 2022-04-28 LAB — CMP14+EGFR
ALT: 22 IU/L (ref 0–44)
AST: 21 IU/L (ref 0–40)
Albumin/Globulin Ratio: 1.8 (ref 1.2–2.2)
Albumin: 4.5 g/dL (ref 3.9–4.9)
Alkaline Phosphatase: 90 IU/L (ref 44–121)
BUN/Creatinine Ratio: 14 (ref 10–24)
BUN: 13 mg/dL (ref 8–27)
Bilirubin Total: 0.7 mg/dL (ref 0.0–1.2)
CO2: 23 mmol/L (ref 20–29)
Calcium: 9.7 mg/dL (ref 8.6–10.2)
Chloride: 105 mmol/L (ref 96–106)
Creatinine, Ser: 0.93 mg/dL (ref 0.76–1.27)
Globulin, Total: 2.5 g/dL (ref 1.5–4.5)
Glucose: 110 mg/dL — ABNORMAL HIGH (ref 70–99)
Potassium: 5 mmol/L (ref 3.5–5.2)
Sodium: 142 mmol/L (ref 134–144)
Total Protein: 7 g/dL (ref 6.0–8.5)
eGFR: 89 mL/min/{1.73_m2} (ref >59)

## 2022-04-28 LAB — LIPID PANEL
Chol/HDL Ratio: 3.5 ratio (ref 0.0–5.0)
Cholesterol, Total: 154 mg/dL (ref 100–199)
HDL: 44 mg/dL (ref >39)
LDL Chol Calc (NIH): 76 mg/dL (ref 0–99)
Triglycerides: 205 mg/dL — ABNORMAL HIGH (ref 0–149)
VLDL Cholesterol Cal: 34 mg/dL (ref 5–40)

## 2022-04-28 LAB — PSA, TOTAL AND FREE
PSA, Free Pct: 22.9 %
PSA, Free: 0.48 ng/mL
Prostate Specific Ag, Serum: 2.1 ng/mL (ref 0.0–4.0)

## 2022-05-03 ENCOUNTER — Ambulatory Visit (INDEPENDENT_AMBULATORY_CARE_PROVIDER_SITE_OTHER): Payer: Medicare Other | Admitting: Family Medicine

## 2022-05-03 ENCOUNTER — Encounter: Payer: Self-pay | Admitting: Family Medicine

## 2022-05-03 VITALS — BP 139/84 | HR 96 | Ht 70.0 in | Wt 225.0 lb

## 2022-05-03 DIAGNOSIS — K219 Gastro-esophageal reflux disease without esophagitis: Secondary | ICD-10-CM

## 2022-05-03 DIAGNOSIS — Z0000 Encounter for general adult medical examination without abnormal findings: Secondary | ICD-10-CM

## 2022-05-03 DIAGNOSIS — E7800 Pure hypercholesterolemia, unspecified: Secondary | ICD-10-CM

## 2022-05-03 DIAGNOSIS — Z23 Encounter for immunization: Secondary | ICD-10-CM

## 2022-05-03 DIAGNOSIS — Z0001 Encounter for general adult medical examination with abnormal findings: Secondary | ICD-10-CM | POA: Diagnosis not present

## 2022-05-03 NOTE — Progress Notes (Signed)
BP 139/84   Pulse 96   Ht '5\' 10"'  (1.778 m)   Wt 225 lb (102.1 kg)   SpO2 96%   BMI 32.28 kg/m    Subjective:   Patient ID: Michael Clark, male    DOB: 1956-03-15, 66 y.o.   MRN: [id number]  HPI: MAXXWELL EDGETT is a 66 y.o. male presenting on 05/03/2022 for Medical Management of Chronic Issues (CPE)   HPI Physical exam Patient denies any chest pain, shortness of breath, headaches or vision issues, abdominal complaints, diarrhea, nausea, vomiting, or joint issues.   GERD Patient is currently on pantoprazole.  She denies any major symptoms or abdominal pain or belching or burping. She denies any blood in her stool or lightheadedness or dizziness.   Hyperlipidemia Patient is coming in for recheck of his hyperlipidemia. The patient is currently taking Crestor. They deny any issues with myalgias or history of liver damage from it. They deny any focal numbness or weakness or chest pain.   Relevant past medical, surgical, family and social history reviewed and updated as indicated. Interim medical history since our last visit reviewed. Allergies and medications reviewed and updated.  Review of Systems  Constitutional:  Negative for chills and fever.  Eyes:  Negative for visual disturbance.  Respiratory:  Negative for shortness of breath and wheezing.   Cardiovascular:  Negative for chest pain and leg swelling.  Musculoskeletal:  Negative for back pain and gait problem.  Skin:  Negative for rash.  Neurological:  Negative for dizziness, weakness and light-headedness.  All other systems reviewed and are negative.   Per HPI unless specifically indicated above   Allergies as of 05/03/2022   No Known Allergies      Medication List        Accurate as of May 03, 2022  4:04 PM. If you have any questions, ask your nurse or doctor.          STOP taking these medications    Saxenda 18 MG/3ML Sopn Generic drug: Liraglutide -Weight Management Stopped by: Worthy Rancher, MD   Semaglutide-Weight Management 0.5 MG/0.5ML Soaj Stopped by: Worthy Rancher, MD   Semaglutide-Weight Management 1 MG/0.5ML Soaj Stopped by: Worthy Rancher, MD   Semaglutide-Weight Management 1.7 MG/0.75ML Soaj Stopped by: Worthy Rancher, MD   Semaglutide-Weight Management 2.4 MG/0.75ML Soaj Stopped by: Fransisca Kaufmann Ki Luckman, MD       TAKE these medications    cholecalciferol 1000 units tablet Commonly known as: VITAMIN D Take 2,000 Units by mouth daily.   cyanocobalamin 1000 MCG tablet Take 1,000 mcg by mouth daily.   multivitamin with minerals Tabs tablet Take 1 tablet by mouth daily.   pantoprazole 40 MG tablet Commonly known as: PROTONIX TAKE ONE (1) TABLET EACH DAY   rosuvastatin 5 MG tablet Commonly known as: CRESTOR TAKE ONE (1) TABLET EACH DAY         Objective:   BP 139/84   Pulse 96   Ht '5\' 10"'  (1.778 m)   Wt 225 lb (102.1 kg)   SpO2 96%   BMI 32.28 kg/m   Wt Readings from Last 3 Encounters:  05/03/22 225 lb (102.1 kg)  03/18/22 225 lb (102.1 kg)  12/02/21 216 lb (98 kg)    Physical Exam Vitals and nursing note reviewed.  Constitutional:      General: He is not in acute distress.    Appearance: He is well-developed. He is not diaphoretic.  Eyes:  General: No scleral icterus.    Conjunctiva/sclera: Conjunctivae normal.  Neck:     Thyroid: No thyromegaly.  Cardiovascular:     Rate and Rhythm: Normal rate and regular rhythm.     Heart sounds: Normal heart sounds. No murmur heard. Pulmonary:     Effort: Pulmonary effort is normal. No respiratory distress.     Breath sounds: Normal breath sounds. No wheezing.  Musculoskeletal:        General: No swelling. Normal range of motion.     Cervical back: Neck supple.  Lymphadenopathy:     Cervical: No cervical adenopathy.  Skin:    General: Skin is warm and dry.     Findings: No rash.  Neurological:     Mental Status: He is alert and oriented to person, place,  and time.     Coordination: Coordination normal.  Psychiatric:        Behavior: Behavior normal.     Results for orders placed or performed in visit on 04/27/22  CBC with Differential/Platelet  Result Value Ref Range   WBC 7.3 3.4 - 10.8 x10E3/uL   RBC 5.08 4.14 - 5.80 x10E6/uL   Hemoglobin 15.6 13.0 - 17.7 g/dL   Hematocrit 45.8 37.5 - 51.0 %   MCV 90 79 - 97 fL   MCH 30.7 26.6 - 33.0 pg   MCHC 34.1 31.5 - 35.7 g/dL   RDW 11.9 11.6 - 15.4 %   Platelets 213 150 - 450 x10E3/uL   Neutrophils 41 Not Estab. %   Lymphs 49 Not Estab. %   Monocytes 7 Not Estab. %   Eos 1 Not Estab. %   Basos 1 Not Estab. %   Neutrophils Absolute 3.0 1.4 - 7.0 x10E3/uL   Lymphocytes Absolute 3.6 (H) 0.7 - 3.1 x10E3/uL   Monocytes Absolute 0.5 0.1 - 0.9 x10E3/uL   EOS (ABSOLUTE) 0.1 0.0 - 0.4 x10E3/uL   Basophils Absolute 0.1 0.0 - 0.2 x10E3/uL   Immature Granulocytes 1 Not Estab. %   Immature Grans (Abs) 0.0 0.0 - 0.1 x10E3/uL  CMP14+EGFR  Result Value Ref Range   Glucose 110 (H) 70 - 99 mg/dL   BUN 13 8 - 27 mg/dL   Creatinine, Ser 0.93 0.76 - 1.27 mg/dL   eGFR 89 >59 mL/min/1.73   BUN/Creatinine Ratio 14 10 - 24   Sodium 142 134 - 144 mmol/L   Potassium 5.0 3.5 - 5.2 mmol/L   Chloride 105 96 - 106 mmol/L   CO2 23 20 - 29 mmol/L   Calcium 9.7 8.6 - 10.2 mg/dL   Total Protein 7.0 6.0 - 8.5 g/dL   Albumin 4.5 3.9 - 4.9 g/dL   Globulin, Total 2.5 1.5 - 4.5 g/dL   Albumin/Globulin Ratio 1.8 1.2 - 2.2   Bilirubin Total 0.7 0.0 - 1.2 mg/dL   Alkaline Phosphatase 90 44 - 121 IU/L   AST 21 0 - 40 IU/L   ALT 22 0 - 44 IU/L  Lipid panel  Result Value Ref Range   Cholesterol, Total 154 100 - 199 mg/dL   Triglycerides 205 (H) 0 - 149 mg/dL   HDL 44 >39 mg/dL   VLDL Cholesterol Cal 34 5 - 40 mg/dL   LDL Chol Calc (NIH) 76 0 - 99 mg/dL   Chol/HDL Ratio 3.5 0.0 - 5.0 ratio  PSA, total and free  Result Value Ref Range   Prostate Specific Ag, Serum 2.1 0.0 - 4.0 ng/mL   PSA, Free 0.48 N/A ng/mL  PSA, Free Pct 22.9 %    Assessment & Plan:   Problem List Items Addressed This Visit       Digestive   GERD (gastroesophageal reflux disease)   Relevant Orders   CBC with Differential/Platelet   CMP14+EGFR     Other   Hyperlipidemia   Relevant Orders   CMP14+EGFR   Lipid panel   Other Visit Diagnoses     Physical exam    -  Primary   Relevant Orders   CBC with Differential/Platelet   CMP14+EGFR   Lipid panel       Looks to be doing well except may be focus on diet for the triglycerides and glucose but it is not doing well. Follow up plan: Return in about 6 months (around 11/01/2022), or if symptoms worsen or fail to improve, for cholesterol.  Counseling provided for all of the vaccine components Orders Placed This Encounter  Procedures   CBC with Differential/Platelet   CMP14+EGFR   Lipid panel    Caryl Pina, MD [facility] 05/03/2022, 4:04 PM

## 2022-05-06 ENCOUNTER — Encounter: Payer: Self-pay | Admitting: Family Medicine

## 2022-05-06 MED ORDER — TADALAFIL 10 MG PO TABS: 10.0000 mg | ORAL_TABLET | ORAL | 1 refills | Status: DC | PRN

## 2022-05-11 ENCOUNTER — Ambulatory Visit (INDEPENDENT_AMBULATORY_CARE_PROVIDER_SITE_OTHER): Payer: Medicare Other | Admitting: Nurse Practitioner

## 2022-05-11 DIAGNOSIS — J0110 Acute frontal sinusitis, unspecified: Secondary | ICD-10-CM | POA: Diagnosis not present

## 2022-05-11 MED ORDER — AMOXICILLIN-POT CLAVULANATE 875-125 MG PO TABS: 1.0000 | ORAL_TABLET | Freq: Two times a day (BID) | ORAL | 0 refills | Status: DC

## 2022-05-11 NOTE — Patient Instructions (Signed)
1. Take meds as prescribed 2. Use a cool mist humidifier especially during the winter months and when heat has been humid. 3. Use saline nose sprays frequently 4. Saline irrigations of the nose can be very helpful if done frequently.  * 4X daily for 1 week*  * Use of a nettie pot can be helpful with this. Follow directions with this* 5. Drink plenty of fluids 6. Keep thermostat turn down low 7.For any cough or congestion- mucinex OTC if needed 8. For fever or aces or pains- take tylenol or ibuprofen appropriate for age and weight.  * for fevers greater than 101 orally you may alternate ibuprofen and tylenol every  3 hours.    

## 2022-05-11 NOTE — Progress Notes (Signed)
Virtual Visit  Note Due to COVID-19 pandemic this visit was conducted virtually. This visit type was conducted due to national recommendations for restrictions regarding the COVID-19 Pandemic (e.g. social distancing, sheltering in place) in an effort to limit this patient's exposure and mitigate transmission in our community. All issues noted in this document were discussed and addressed.  A physical exam was not performed with this format.  I connected with Michael Clark on 05/11/22 at 11:44 by telephone and verified that I am speaking with the correct person using two identifiers. Michael Clark is currently located at in his car and no one is currently with him during visit. The provider, Mary-Margaret Hassell Done, FNP is located in their office at time of visit.  I discussed the limitations, risks, security and privacy concerns of performing an evaluation and management service by telephone and the availability of in person appointments. I also discussed with the patient that there may be a patient responsible charge related to this service. The patient expressed understanding and agreed to proceed.   History and Present Illness:  URI  This is a new problem. The current episode started in the past 7 days. The problem has been waxing and waning. There has been no fever. Associated symptoms include congestion, coughing, headaches, rhinorrhea and sinus pain. He has tried antihistamine and decongestant for the symptoms. The treatment provided mild relief.  Dizziness This is a new problem. The current episode started yesterday. The problem has been resolved. Associated symptoms include congestion, coughing and headaches. Pertinent negatives include no chills or fever. Nothing aggravates the symptoms.      Review of Systems  Constitutional:  Negative for chills and fever.  HENT:  Positive for congestion, rhinorrhea and sinus pain.   Respiratory:  Positive for cough.   Neurological:  Positive  for dizziness and headaches.     Observations/Objective: Alert and oriented- answers all questions appropriately No distress Raspy voice Facial pressure over eyes Dry cough noted  Assessment and Plan: Michael Clark in today with chief complaint of URI and Dizziness   1. Acute non-recurrent frontal sinusitis 1. Take meds as prescribed 2. Use a cool mist humidifier especially during the winter months and when heat has been humid. 3. Use saline nose sprays frequently 4. Saline irrigations of the nose can be very helpful if done frequently.  * 4X daily for 1 week*  * Use of a nettie pot can be helpful with this. Follow directions with this* 5. Drink plenty of fluids 6. Keep thermostat turn down low 7.For any cough or congestion- mucinex OTC 8. For fever or aces or pains- take tylenol or ibuprofen appropriate for age and weight.  * for fevers greater than 101 orally you may alternate ibuprofen and tylenol every  3 hours.     Follow Up Instructions: prn    I discussed the assessment and treatment plan with the patient. The patient was provided an opportunity to ask questions and all were answered. The patient agreed with the plan and demonstrated an understanding of the instructions.   The patient was advised to call back or seek an in-person evaluation if the symptoms worsen or if the condition fails to improve as anticipated.  The above assessment and management plan was discussed with the patient. The patient verbalized understanding of and has agreed to the management plan. Patient is aware to call the clinic if symptoms persist or worsen. Patient is aware when to return to the clinic for a  follow-up visit. Patient educated on when it is appropriate to go to the emergency department.   Time call ended:  11:55  I provided 11 minutes of  non face-to-face time during this encounter.    Mary-Margaret Hassell Done, FNP

## 2022-05-27 ENCOUNTER — Other Ambulatory Visit: Payer: Self-pay | Admitting: Family Medicine

## 2022-05-27 DIAGNOSIS — K219 Gastro-esophageal reflux disease without esophagitis: Secondary | ICD-10-CM

## 2022-06-14 ENCOUNTER — Encounter: Payer: Self-pay | Admitting: Family Medicine

## 2022-06-16 ENCOUNTER — Ambulatory Visit (INDEPENDENT_AMBULATORY_CARE_PROVIDER_SITE_OTHER): Payer: Medicare Other

## 2022-06-16 DIAGNOSIS — Z23 Encounter for immunization: Secondary | ICD-10-CM

## 2022-06-29 ENCOUNTER — Other Ambulatory Visit: Payer: Self-pay | Admitting: Family Medicine

## 2022-07-13 DIAGNOSIS — M13842 Other specified arthritis, left hand: Secondary | ICD-10-CM | POA: Diagnosis not present

## 2022-07-13 DIAGNOSIS — M79645 Pain in left finger(s): Secondary | ICD-10-CM | POA: Diagnosis not present

## 2022-07-13 DIAGNOSIS — L820 Inflamed seborrheic keratosis: Secondary | ICD-10-CM | POA: Diagnosis not present

## 2022-08-15 ENCOUNTER — Encounter: Payer: Self-pay | Admitting: Family Medicine

## 2022-08-17 ENCOUNTER — Ambulatory Visit: Payer: Medicare Other

## 2022-08-25 DIAGNOSIS — M5451 Vertebrogenic low back pain: Secondary | ICD-10-CM | POA: Diagnosis not present

## 2022-09-06 ENCOUNTER — Ambulatory Visit: Payer: Medicare Other | Attending: Orthopedic Surgery

## 2022-09-06 ENCOUNTER — Encounter: Payer: Self-pay | Admitting: Family Medicine

## 2022-09-06 ENCOUNTER — Other Ambulatory Visit: Payer: Self-pay

## 2022-09-06 DIAGNOSIS — M5459 Other low back pain: Secondary | ICD-10-CM

## 2022-09-06 NOTE — Therapy (Signed)
OUTPATIENT PHYSICAL THERAPY THORACOLUMBAR EVALUATION   Patient Name: Michael Clark MRN: [id number] DOB:1956-03-16, 67 y.o., male Today's Date: 09/06/2022  END OF SESSION:  PT End of Session - 09/06/22 0859     Visit Number 1    Number of Visits 8    Date for PT Re-Evaluation 11/05/22    PT Start Time 0901    PT Stop Time 0945    PT Time Calculation (min) 44 min    Activity Tolerance Patient tolerated treatment well    Behavior During Therapy WFL for tasks assessed/performed             Past Medical History:  Diagnosis Date   BPH (benign prostatic hyperplasia)    GERD (gastroesophageal reflux disease)    Hyperlipidemia    Trigger finger, left    Vitamin D deficiency    Past Surgical History:  Procedure Laterality Date   APPENDECTOMY     CARPAL TUNNEL RELEASE  2006   LEFT    EXTRACORPOREAL SHOCK WAVE LITHOTRIPSY Left 03/13/2020   Procedure: EXTRACORPOREAL SHOCK WAVE LITHOTRIPSY (ESWL);  Surgeon: [name], MD;  Location: [facility];  Service: Urology;  Laterality: Left;   EYE SURGERY Bilateral    cataracts   LEFT THUM PULLEY RELEASE  2006--  DONE W/ CARPAL TUNNEL RELEASE   TRIGGER FINGER RELEASE  07/16/2011   Procedure: RELEASE TRIGGER FINGER/A-1 PULLEY;  Surgeon: [name];  Location: [place];  Service: Orthopedics;  Laterality: Left;   Patient Active Problem List   Diagnosis Date Noted   Left ureteral stone 12/20/2018   Aortic atherosclerosis ([place]) 12/20/2018   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   Erectile dysfunction 05/29/2015   Spondylolisthesis, 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   GERD (gastroesophageal reflux disease) 10/17/2013   BPH (benign prostatic hyperplasia) 10/17/2013   Vitamin D deficiency 10/17/2013   Hyperlipidemia 10/17/2013    PCP: Dettinger, Fransisca Kaufmann, MD  REFERRING PROVIDER: Melina Schools, MD  REFERRING DIAG: Pain in lumbar spine   Rationale for Evaluation and Treatment:  Rehabilitation  THERAPY DIAG:  Other low back pain  ONSET DATE: 8-9 years  SUBJECTIVE:                                                                                                                                                                                           SUBJECTIVE STATEMENT: Patient reports that he has been having back pain for about 8-9 years with days where the pain is very debilitating, but then it gets better. He notes that the most recent bout has been lasting for a few months. He notices that his pain  is worse in the morning. However, it does not wake him up at night.  He notes that he does not exercise much and that is part of the problem.   PERTINENT HISTORY:  Unremarkable  PAIN:  Are you having pain? Yes: NPRS scale: 6-7/10 Pain location: right low back Pain description: stabbing, ache Aggravating factors: walking up in the morning, bending over (intermittent)  Relieving factors: medication, some movement  PRECAUTIONS: None  WEIGHT BEARING RESTRICTIONS: No  FALLS:  Has patient fallen in last 6 months? No  OCCUPATION: retired  PLOF: Independent  PATIENT GOALS: reduced pain, be able to pick up his grandchildren (up to 50 pounds), be more active, establish an exercise routine to continue with after completing therapy, along with improved strength and flexibility  NEXT MD VISIT: April 2024   OBJECTIVE:   PATIENT SURVEYS:  FOTO 71.75  SCREENING FOR RED FLAGS: Bowel or bladder incontinence: No Spinal tumors: No Cauda equina syndrome: No Compression fracture: No Abdominal aneurysm: No  COGNITION: Overall cognitive status: Within functional limits for tasks assessed     SENSATION: Patient reports numbness in the 3rd toe of both feet.   POSTURE: rounded shoulders, forward head, and decreased lumbar lordosis  PALPATION: TTP: bilateral lumbar paraspinals and SIJ   LUMBAR JOINT MOBILITY:   WFL with L5-S1 causing slight discomfort   LUMBAR  ROM:   AROM eval  Flexion 54  Extension 12  Right lateral flexion WFL; increased pain   Left lateral flexion WFL   Right rotation WFL   Left rotation WFL   (Blank rows = not tested)  LOWER EXTREMITY ROM:  WFL for activities assessed   LOWER EXTREMITY MMT:    MMT Right eval Left eval  Hip flexion 4-/5 4/5  Hip extension    Hip abduction    Hip adduction    Hip internal rotation    Hip external rotation    Knee flexion 4-/5 4+/5  Knee extension 5/5 5/5  Ankle dorsiflexion 4/5 4/5  Ankle plantarflexion    Ankle inversion    Ankle eversion     (Blank rows = not tested)  LUMBAR SPECIAL TESTS:  Straight leg raise test: Negative SI compression: negative  GAIT: Assistive device utilized: None Level of assistance: Complete Independence Comments: no significant gait deviations observed  TODAY'S TREATMENT:                                                                                                                              DATE:                                     1/29 EXERCISE LOG  Exercise Repetitions and Resistance Comments  Lower trunk rotation 10 reps    Bridge  5 reps  Slight increase in discomfort  Glute sets  5 reps with 5 second hold   Single  knee to chest  2 reps each w/ 15 second hold    Log roll   For improved bed mobility   Blank cell = exercise not performed today   PATIENT EDUCATION:  Education details: HEP, benefits of exercise, POC, prognosis, and goal for therapy Person educated: Patient Education method: Explanation Education comprehension: verbalized understanding  HOME EXERCISE PROGRAM: [id number]  ASSESSMENT:  CLINICAL IMPRESSION: Patient is a 67 y.o. male who was seen today for physical therapy evaluation and treatment for chronic low back pain.  He presented with moderate pain severity and low irritability with right lumbar sidebending increasing his familiar pain.  He was provided a home exercise program which she was able to  properly demonstrate.  He reported feeling comfortable with these interventions.  Recommend that he continue with skilled physical therapy to address his impairments to return to his prior level of function.  OBJECTIVE IMPAIRMENTS: decreased activity tolerance, decreased mobility, decreased ROM, decreased strength, postural dysfunction, and pain.   ACTIVITY LIMITATIONS: bending, standing, sleeping, transfers, and bed mobility  PARTICIPATION LIMITATIONS: community activity and yard work  PERSONAL FACTORS: Time since onset of injury/illness/exacerbation are also affecting patient's functional outcome.   REHAB POTENTIAL: Good  CLINICAL DECISION MAKING: Evolving/moderate complexity  EVALUATION COMPLEXITY: Moderate   GOALS: Goals reviewed with patient? Yes  LONG TERM GOALS: Target date: 10/04/22  Patient will be independent with his HEP.  Baseline:  Goal status: INITIAL  2.  Patient will be able to complete his daily activities without his familiar pain exceeding 4/10. Baseline:  Goal status: INITIAL  3.  Patient will be able to lift at least 25 pounds without being limited by his familiar low back pain for improved function playing with his grandchildren.  Baseline:  Goal status: INITIAL  4.  Patient will report being able to return to regular exercise activities without being limited by his familiar low back pain. Baseline:  Goal status: INITIAL  PLAN:  PT FREQUENCY: 2x/week  PT DURATION: 4 weeks  PLANNED INTERVENTIONS: Therapeutic exercises, Therapeutic activity, Neuromuscular re-education, Balance training, Gait training, Patient/Family education, Self Care, Joint mobilization, Electrical stimulation, Spinal mobilization, Cryotherapy, Moist heat, Traction, Manual therapy, and Re-evaluation.  PLAN FOR NEXT SESSION: nustep, review HEP, lumbar and lower extremity strengthening, and modalities as needed   Darlin Coco, PT 09/06/2022, 4:36 PM

## 2022-09-09 ENCOUNTER — Ambulatory Visit (INDEPENDENT_AMBULATORY_CARE_PROVIDER_SITE_OTHER): Payer: Medicare Other | Admitting: *Deleted

## 2022-09-09 ENCOUNTER — Ambulatory Visit: Payer: Medicare Other | Attending: Orthopedic Surgery

## 2022-09-09 DIAGNOSIS — M5459 Other low back pain: Secondary | ICD-10-CM | POA: Diagnosis not present

## 2022-09-09 DIAGNOSIS — Z23 Encounter for immunization: Secondary | ICD-10-CM

## 2022-09-09 NOTE — Therapy (Addendum)
OUTPATIENT PHYSICAL THERAPY THORACOLUMBAR TREATMENT   Patient Name: Michael Clark MRN: [id number] DOB:Oct 29, 1955, 67 y.o., male Today's Date: 09/09/2022  END OF SESSION:  PT End of Session - 09/09/22 0947     Visit Number 2    Number of Visits 8    Date for PT Re-Evaluation 11/05/22    PT Start Time 0945    PT Stop Time 1028    PT Time Calculation (min) 43 min    Activity Tolerance Patient tolerated treatment well    Behavior During Therapy WFL for tasks assessed/performed              Past Medical History:  Diagnosis Date   BPH (benign prostatic hyperplasia)    GERD (gastroesophageal reflux disease)    Hyperlipidemia    Trigger finger, left    Vitamin D deficiency    Past Surgical History:  Procedure Laterality Date   APPENDECTOMY     CARPAL TUNNEL RELEASE  2006   LEFT    EXTRACORPOREAL SHOCK WAVE LITHOTRIPSY Left 03/13/2020   Procedure: EXTRACORPOREAL SHOCK WAVE LITHOTRIPSY (ESWL);  Surgeon: Bjorn Pippin, MD;  Location: [facility];  Service: Urology;  Laterality: Left;   EYE SURGERY Bilateral    cataracts   LEFT THUM PULLEY RELEASE  2006--  DONE W/ CARPAL TUNNEL RELEASE   TRIGGER FINGER RELEASE  07/16/2011   Procedure: RELEASE TRIGGER FINGER/A-1 PULLEY;  Surgeon: Illene Labrador [place];  Location: [facility];  Service: Orthopedics;  Laterality: Left;   Patient Active Problem List   Diagnosis Date Noted   Left ureteral stone 12/20/2018   Aortic atherosclerosis (HCC) 12/20/2018   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   Erectile dysfunction 05/29/2015   Spondylolisthesis, 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   GERD (gastroesophageal reflux disease) 10/17/2013   BPH (benign prostatic hyperplasia) 10/17/2013   Vitamin D deficiency 10/17/2013   Hyperlipidemia 10/17/2013    PCP: Dettinger, Elige Radon, MD  REFERRING PROVIDER: Venita Lick, MD  REFERRING DIAG: Pain in lumbar spine   Rationale for Evaluation and Treatment:  Rehabilitation  THERAPY DIAG:  Other low back pain  ONSET DATE: 8-9 years  SUBJECTIVE:                                                                                                                                                                                           SUBJECTIVE STATEMENT: Patient reports that his back is a little stiff this morning. However, his HEP has been helping especially when he gets up in the morning.   PERTINENT HISTORY:  Unremarkable  PAIN:  Are you having pain? Yes: NPRS scale: 3/10 Pain location:  right low back Pain description: stabbing, ache Aggravating factors: walking up in the morning, bending over (intermittent)  Relieving factors: medication, some movement  PRECAUTIONS: None  WEIGHT BEARING RESTRICTIONS: No  FALLS:  Has patient fallen in last 6 months? No  OCCUPATION: retired  PLOF: Independent  PATIENT GOALS: reduced pain, be able to pick up his grandchildren (up to 50 pounds), be more active, establish an exercise routine to continue with after completing therapy, along with improved strength and flexibility  NEXT MD VISIT: April 2024   OBJECTIVE: all objective measures were assessed at his initial evaluation on 09/06/22 unless otherwise noted  PATIENT SURVEYS:  FOTO 71.75  SCREENING FOR RED FLAGS: Bowel or bladder incontinence: No Spinal tumors: No Cauda equina syndrome: No Compression fracture: No Abdominal aneurysm: No  COGNITION: Overall cognitive status: Within functional limits for tasks assessed     SENSATION: Patient reports numbness in the 3rd toe of both feet.   POSTURE: rounded shoulders, forward head, and decreased lumbar lordosis  PALPATION: TTP: bilateral lumbar paraspinals and SIJ   LUMBAR JOINT MOBILITY:   WFL with L5-S1 causing slight discomfort   LUMBAR ROM:   AROM eval  Flexion 54  Extension 12  Right lateral flexion WFL; increased pain   Left lateral flexion WFL   Right rotation WFL    Left rotation WFL   (Blank rows = not tested)  LOWER EXTREMITY ROM:  WFL for activities assessed   LOWER EXTREMITY MMT:    MMT Right eval Left eval  Hip flexion 4-/5 4/5  Hip extension    Hip abduction    Hip adduction    Hip internal rotation    Hip external rotation    Knee flexion 4-/5 4+/5  Knee extension 5/5 5/5  Ankle dorsiflexion 4/5 4/5  Ankle plantarflexion    Ankle inversion    Ankle eversion     (Blank rows = not tested)  LUMBAR SPECIAL TESTS:  Straight leg raise test: Negative SI compression: negative  GAIT: Assistive device utilized: None Level of assistance: Complete Independence Comments: no significant gait deviations observed  TODAY'S TREATMENT:                                                                                                                              DATE:                                     2/1 EXERCISE LOG  Exercise Repetitions and Resistance Comments  Nustep L4 x 14 minutes   Resisted pull down  Blue XTS; 2 minutes   Wood chops Blue XTS; 30 minutes each    Ball roll out  2 minutes   Seated HS stretch  3 x 30 second hold each    Eccentric heel tap  6" step; 2 x 10 reps    Tandem stance on foam  3 x 30 second each     Blank cell = exercise not performed today                                    1/29 EXERCISE LOG  Exercise Repetitions and Resistance Comments  Lower trunk rotation 10 reps    Bridge  5 reps  Slight increase in discomfort  Glute sets  5 reps with 5 second hold   Single knee to chest  2 reps each w/ 15 second hold    Log roll   For improved bed mobility   Blank cell = exercise not performed today   PATIENT EDUCATION:  Education details: HEP, benefits of exercise, POC, prognosis, and goal for therapy Person educated: Patient Education method: Explanation Education comprehension: verbalized understanding  HOME EXERCISE PROGRAM: [id number]  ASSESSMENT:  CLINICAL IMPRESSION: Patient was introduced to  multiple new interventions for improved lumbar mobility and strength. He required minimal cueing with today's new interventions for proper exercise to facilitate improved lumbar mobility. He experienced no increase in his familiar pain or discomfort with any of today's interventions. He reported feeling a little better upon the conclusion of treatment. He continues to require skilled physical therapy to address his remaining impairments to return to his prior level of function.   PHYSICAL THERAPY DISCHARGE SUMMARY  Visits from Start of Care: 2  Current functional level related to goals / functional outcomes: Patient was unable to meet his goals for skilled physical therapy as he did not complete his recommended plan of care.    Remaining deficits: Pain    Education / Equipment: HEP   Patient agrees to discharge. Patient goals were not met. Patient is being discharged due to not returning since the last visit.  Candi Leash, PT, DPT    OBJECTIVE IMPAIRMENTS: decreased activity tolerance, decreased mobility, decreased ROM, decreased strength, postural dysfunction, and pain.   ACTIVITY LIMITATIONS: bending, standing, sleeping, transfers, and bed mobility  PARTICIPATION LIMITATIONS: community activity and yard work  PERSONAL FACTORS: Time since onset of injury/illness/exacerbation are also affecting patient's functional outcome.   REHAB POTENTIAL: Good  CLINICAL DECISION MAKING: Evolving/moderate complexity  EVALUATION COMPLEXITY: Moderate   GOALS: Goals reviewed with patient? Yes  LONG TERM GOALS: Target date: 10/04/22  Patient will be independent with his HEP.  Baseline:  Goal status: INITIAL  2.  Patient will be able to complete his daily activities without his familiar pain exceeding 4/10. Baseline:  Goal status: INITIAL  3.  Patient will be able to lift at least 25 pounds without being limited by his familiar low back pain for improved function playing with his  grandchildren.  Baseline:  Goal status: INITIAL  4.  Patient will report being able to return to regular exercise activities without being limited by his familiar low back pain. Baseline:  Goal status: INITIAL  PLAN:  PT FREQUENCY: 2x/week  PT DURATION: 4 weeks  PLANNED INTERVENTIONS: Therapeutic exercises, Therapeutic activity, Neuromuscular re-education, Balance training, Gait training, Patient/Family education, Self Care, Joint mobilization, Electrical stimulation, Spinal mobilization, Cryotherapy, Moist heat, Traction, Manual therapy, and Re-evaluation.  PLAN FOR NEXT SESSION: nustep, review HEP, lumbar and lower extremity strengthening, and modalities as needed   Granville Lewis, PT 09/09/2022, 10:44 AM

## 2022-09-09 NOTE — Progress Notes (Signed)
2nd shingrix injection given, left deltoid, intramuscular. Patient tolerated well

## 2022-09-16 ENCOUNTER — Ambulatory Visit: Payer: Medicare Other

## 2022-12-06 ENCOUNTER — Ambulatory Visit (INDEPENDENT_AMBULATORY_CARE_PROVIDER_SITE_OTHER): Payer: Medicare Other

## 2022-12-06 VITALS — Ht 71.0 in | Wt 220.0 lb

## 2022-12-06 DIAGNOSIS — Z0000 Encounter for general adult medical examination without abnormal findings: Secondary | ICD-10-CM | POA: Diagnosis not present

## 2022-12-06 NOTE — Patient Instructions (Signed)
[name] , Thank you for taking time to come for your Medicare Wellness Visit. I appreciate your ongoing commitment to your health goals. Please review the following plan we discussed and let me know if I can assist you in the future.   These are the goals we discussed:  Goals      Increase physical activity     Lose weight and get more active and healthier Goals Addressed             This Visit's Progress    Increase physical activity       Lose weight and get more active and healthier              This is a list of the screening recommended for you and due dates:  Health Maintenance  Topic Date Due   COVID-19 Vaccine (5 - 2023-24 season) 04/09/2022   Flu Shot  03/10/2023   Medicare Annual Wellness Visit  12/06/2023   Colon Cancer Screening  10/08/2025   DTaP/Tdap/Td vaccine (3 - Td or Tdap) 10/22/2031   Pneumonia Vaccine  Completed   Hepatitis C Screening: USPSTF Recommendation to screen - Ages 66-79 yo.  Completed   Zoster (Shingles) Vaccine  Completed   HPV Vaccine  Aged Out   Stool Blood Test  Discontinued    Advanced directives: Advance directive discussed with you today. I have provided a copy for you to complete at home and have notarized. Once this is complete please bring a copy in to our office so we can scan it into your chart.   Conditions/risks identified: Aim for 30 minutes of exercise or brisk walking, 6-8 glasses of water, and 5 servings of fruits and vegetables each day.   Next appointment: Follow up in one year for your annual wellness visit.   Preventive Care 43 Years and Older, Male  Preventive care refers to lifestyle choices and visits with your health care provider that can promote health and wellness. What does preventive care include? A yearly physical exam. This is also called an annual well check. Dental exams once or twice a year. Routine eye exams. Ask your health care provider how often you should have your eyes checked. Personal  lifestyle choices, including: Daily care of your teeth and gums. Regular physical activity. Eating a healthy diet. Avoiding tobacco and drug use. Limiting alcohol use. Practicing safe sex. Taking low doses of aspirin every day. Taking vitamin and mineral supplements as recommended by your health care provider. What happens during an annual well check? The services and screenings done by your health care provider during your annual well check will depend on your age, overall health, lifestyle risk factors, and family history of disease. Counseling  Your health care provider may ask you questions about your: Alcohol use. Tobacco use. Drug use. Emotional well-being. Home and relationship well-being. Sexual activity. Eating habits. History of falls. Memory and ability to understand (cognition). Work and work Astronomer. Screening  You may have the following tests or measurements: Height, weight, and BMI. Blood pressure. Lipid and cholesterol levels. These may be checked every 5 years, or more frequently if you are over 59 years old. Skin check. Lung cancer screening. You may have this screening every year starting at age 103 if you have a 30-pack-year history of smoking and currently smoke or have quit within the past 15 years. Fecal occult blood test (FOBT) of the stool. You may have this test every year starting at age 20. Flexible sigmoidoscopy or  colonoscopy. You may have a sigmoidoscopy every 5 years or a colonoscopy every 10 years starting at age 70. Prostate cancer screening. Recommendations will vary depending on your family history and other risks. Hepatitis C blood test. Hepatitis B blood test. Sexually transmitted disease (STD) testing. Diabetes screening. This is done by checking your blood sugar (glucose) after you have not eaten for a while (fasting). You may have this done every 1-3 years. Abdominal aortic aneurysm (AAA) screening. You may need this if you are a  current or former smoker. Osteoporosis. You may be screened starting at age 71 if you are at high risk. Talk with your health care provider about your test results, treatment options, and if necessary, the need for more tests. Vaccines  Your health care provider may recommend certain vaccines, such as: Influenza vaccine. This is recommended every year. Tetanus, diphtheria, and acellular pertussis (Tdap, Td) vaccine. You may need a Td booster every 10 years. Zoster vaccine. You may need this after age 92. Pneumococcal 13-valent conjugate (PCV13) vaccine. One dose is recommended after age 86. Pneumococcal polysaccharide (PPSV23) vaccine. One dose is recommended after age 56. Talk to your health care provider about which screenings and vaccines you need and how often you need them. This information is not intended to replace advice given to you by your health care provider. Make sure you discuss any questions you have with your health care provider. Document Released: 08/22/2015 Document Revised: 04/14/2016 Document Reviewed: 05/27/2015 Elsevier Interactive Patient Education  2017 [place] Prevention in the Home Falls can cause injuries. They can happen to people of all ages. There are many things you can do to make your home safe and to help prevent falls. What can I do on the outside of my home? Regularly fix the edges of walkways and driveways and fix any cracks. Remove anything that might make you trip as you walk through a door, such as a raised step or threshold. Trim any bushes or trees on the path to your home. Use bright outdoor lighting. Clear any walking paths of anything that might make someone trip, such as rocks or tools. Regularly check to see if handrails are loose or broken. Make sure that both sides of any steps have handrails. Any raised decks and porches should have guardrails on the edges. Have any leaves, snow, or ice cleared regularly. Use sand or salt on  walking paths during winter. Clean up any spills in your garage right away. This includes oil or grease spills. What can I do in the bathroom? Use night lights. Install grab bars by the toilet and in the tub and shower. Do not use towel bars as grab bars. Use non-skid mats or decals in the tub or shower. If you need to sit down in the shower, use a plastic, non-slip stool. Keep the floor dry. Clean up any water that spills on the floor as soon as it happens. Remove soap buildup in the tub or shower regularly. Attach bath mats securely with double-sided non-slip rug tape. Do not have throw rugs and other things on the floor that can make you trip. What can I do in the bedroom? Use night lights. Make sure that you have a light by your bed that is easy to reach. Do not use any sheets or blankets that are too big for your bed. They should not hang down onto the floor. Have a firm chair that has side arms. You can use this for support while  you get dressed. Do not have throw rugs and other things on the floor that can make you trip. What can I do in the kitchen? Clean up any spills right away. Avoid walking on wet floors. Keep items that you use a lot in easy-to-reach places. If you need to reach something above you, use a strong step stool that has a grab bar. Keep electrical cords out of the way. Do not use floor polish or wax that makes floors slippery. If you must use wax, use non-skid floor wax. Do not have throw rugs and other things on the floor that can make you trip. What can I do with my stairs? Do not leave any items on the stairs. Make sure that there are handrails on both sides of the stairs and use them. Fix handrails that are broken or loose. Make sure that handrails are as long as the stairways. Check any carpeting to make sure that it is firmly attached to the stairs. Fix any carpet that is loose or worn. Avoid having throw rugs at the top or bottom of the stairs. If you do  have throw rugs, attach them to the floor with carpet tape. Make sure that you have a light switch at the top of the stairs and the bottom of the stairs. If you do not have them, ask someone to add them for you. What else can I do to help prevent falls? Wear shoes that: Do not have high heels. Have rubber bottoms. Are comfortable and fit you well. Are closed at the toe. Do not wear sandals. If you use a stepladder: Make sure that it is fully opened. Do not climb a closed stepladder. Make sure that both sides of the stepladder are locked into place. Ask someone to hold it for you, if possible. Clearly mark and make sure that you can see: Any grab bars or handrails. First and last steps. Where the edge of each step is. Use tools that help you move around (mobility aids) if they are needed. These include: Canes. Walkers. Scooters. Crutches. Turn on the lights when you go into a dark area. Replace any light bulbs as soon as they burn out. Set up your furniture so you have a clear path. Avoid moving your furniture around. If any of your floors are uneven, fix them. If there are any pets around you, be aware of where they are. Review your medicines with your doctor. Some medicines can make you feel dizzy. This can increase your chance of falling. Ask your doctor what other things that you can do to help prevent falls. This information is not intended to replace advice given to you by your health care provider. Make sure you discuss any questions you have with your health care provider. Document Released: 05/22/2009 Document Revised: 01/01/2016 Document Reviewed: 08/30/2014 Elsevier Interactive Patient Education  2017 [employer].

## 2022-12-06 NOTE — Progress Notes (Signed)
Subjective:   Michael Clark is a 67 y.o. male who presents for Medicare Annual/Subsequent preventive examination. I connected with  MATE ALEGRIA on 12/06/22 by a audio enabled telemedicine application and verified that I am speaking with the correct person using two identifiers.  Patient Location: Home  Provider Location: Home Office  I discussed the limitations of evaluation and management by telemedicine. The patient expressed understanding and agreed to proceed.  Review of Systems     Cardiac Risk Factors include: advanced age (>77men, >103 women);male gender;dyslipidemia     Objective:    Today's Vitals   12/06/22 0845  Weight: 220 lb (99.8 kg)  Height: 5\' 11"  (1.803 m)   Body mass index is 30.68 kg/m.     12/06/2022    8:49 AM 09/06/2022   12:38 PM 12/02/2021   11:17 AM 12/01/2020    3:35 PM 12/01/2020    1:43 PM 03/13/2020    7:16 AM 11/29/2019    2:38 PM  Advanced Directives  Does Patient Have a Medical Advance Directive? Yes Yes Yes Yes Yes Yes Yes  Type of Estate agent of [place];Living will  Healthcare Power of [place];Living will Healthcare Power of [place];Living will Healthcare Power of [place];Living will Healthcare Power of [place];Living will Living will  Does patient want to make changes to medical advance directive?       No - Patient declined  Copy of Healthcare Power of Attorney in Chart? No - copy requested  No - copy requested No - copy requested No - copy requested      Current Medications (verified) Outpatient Encounter Medications as of 12/06/2022  Medication Sig   cholecalciferol (VITAMIN D) 1000 units tablet Take 2,000 Units by mouth daily.    Multiple Vitamin (MULTIVITAMIN WITH MINERALS) TABS tablet Take 1 tablet by mouth daily.   pantoprazole (PROTONIX) 40 MG tablet TAKE ONE (1) TABLET EACH DAY   rosuvastatin (CRESTOR) 5 MG tablet TAKE ONE (1) TABLET EACH DAY   tadalafil (CIALIS) 10 MG tablet Take 1 tablet (10 mg  total) by mouth every other day as needed for erectile dysfunction.   amoxicillin-clavulanate (AUGMENTIN) 875-125 MG tablet Take 1 tablet by mouth 2 (two) times daily.   cyanocobalamin 1000 MCG tablet Take 1,000 mcg by mouth daily. (Patient not taking: Reported on 12/06/2022)   No facility-administered encounter medications on file as of 12/06/2022.    Allergies (verified) Patient has no known allergies.   History: Past Medical History:  Diagnosis Date   BPH (benign prostatic hyperplasia)    GERD (gastroesophageal reflux disease)    Hyperlipidemia    Trigger finger, left    Vitamin D deficiency    Past Surgical History:  Procedure Laterality Date   APPENDECTOMY     CARPAL TUNNEL RELEASE  2006   LEFT    EXTRACORPOREAL SHOCK WAVE LITHOTRIPSY Left 03/13/2020   Procedure: EXTRACORPOREAL SHOCK WAVE LITHOTRIPSY (ESWL);  Surgeon: Bjorn Pippin, MD;  Location[facility];  Service: Urology;  Laterality: Left;   EYE SURGERY Bilateral    cataracts   LEFT THUM PULLEY RELEASE  2006--  DONE W/ CARPAL TUNNEL RELEASE   TRIGGER FINGER RELEASE  07/16/2011   Procedure: RELEASE TRIGGER FINGER/A-1 PULLEY;  Surgeon: Illene Labrador [place];  Location: [facility];  Service: Orthopedics;  Laterality: Left;   Family History  Problem Relation Age of Onset   Alzheimer's disease Mother    Cancer Father        prostate  Diabetes Father    Social History   Socioeconomic History   Marital status: Married    Spouse name: Not on file   Number of children: 2   Years of education: 12   Highest education level: Bachelor's degree (e.g., BA, AB, BS)  Occupational History    Comment: retired  Tobacco Use   Smoking status: Former    Types: Cigarettes    Quit date: 10/17/2004    Years since quitting: 18.1   Smokeless tobacco: Former  Building services engineer Use: Never used  Substance and Sexual Activity   Alcohol use: Yes    Alcohol/week: 10.0 standard drinks of alcohol    Types:  10 Cans of beer per week   Drug use: No   Sexual activity: Not on file  Other Topics Concern   Not on file  Social History Narrative   Lives with his wife   Social Determinants of Health   Financial Resource Strain: Low Risk  (12/06/2022)   Overall Financial Resource Strain (CARDIA)    Difficulty of Paying Living Expenses: Not hard at all  Food Insecurity: No Food Insecurity (12/06/2022)   Hunger Vital Sign    Worried About Running Out of Food in the Last Year: Never true    Ran Out of Food in the Last Year: Never true  Transportation Needs: No Transportation Needs (12/06/2022)   PRAPARE - Administrator, Civil Service (Medical): No    Lack of Transportation (Non-Medical): No  Physical Activity: Insufficiently Active (12/06/2022)   Exercise Vital Sign    Days of Exercise per Week: 2 days    Minutes of Exercise per Session: 30 min  Stress: No Stress Concern Present (12/06/2022)   [employer] of Occupational Health - Occupational Stress Questionnaire    Feeling of Stress : Not at all  Social Connections: Moderately Integrated (12/06/2022)   Social Connection and Isolation Panel [NHANES]    Frequency of Communication with Friends and Family: More than three times a week    Frequency of Social Gatherings with Friends and Family: More than three times a week    Attends Religious Services: More than 4 times per year    Active Member of [employer] or Organizations: No    Attends Engineer, structural: Never    Marital Status: Married    Tobacco Counseling Counseling given: Not Answered   Clinical Intake:  Pre-visit preparation completed: Yes  Pain : No/denies pain     Nutritional Risks: None Diabetes: No  How often do you need to have someone help you when you read instructions, pamphlets, or other written materials from your doctor or pharmacy?: 1 - Never  Diabetic?no   Interpreter Needed?: No  Information entered by :: Renie Ora,  LPN   Activities of Daily Living    12/06/2022    8:49 AM  In your present state of health, do you have any difficulty performing the following activities:  Hearing? 0  Vision? 0  Difficulty concentrating or making decisions? 0  Walking or climbing stairs? 0  Dressing or bathing? 0  Doing errands, shopping? 0  Preparing Food and eating ? N  Using the Toilet? N  In the past six months, have you accidently leaked urine? N  Do you have problems with loss of bowel control? N  Managing your Medications? N  Managing your Finances? N  Housekeeping or managing your Housekeeping? N    Patient Care Team: Dettinger, Elige Radon, MD as  PCP - General (Family Medicine) Aris Lot, MD as Consulting Physician (Dermatology) Lisbeth Renshaw, MD as Consulting Physician (Neurosurgery)  Indicate any recent Medical Services you may have received from other than [facility] providers in the past year (date may be approximate).     Assessment:   This is a routine wellness examination for Jomar.  Hearing/Vision screen Vision Screening - Comments:: Wears rx glasses - up to date with routine eye exams with  Dr.Stonciffer   Dietary issues and exercise activities discussed: Current Exercise Habits: Home exercise routine, Type of exercise: walking, Time (Minutes): 30, Frequency (Times/Week): 3, Weekly Exercise (Minutes/Week): 90, Intensity: Mild, Exercise limited by: None identified   Goals Addressed             This Visit's Progress    Increase physical activity   On track    Lose weight and get more active and healthier Goals Addressed             This Visit's Progress    Increase physical activity       Lose weight and get more active and healthier             Depression Screen    12/06/2022    8:48 AM 05/03/2022    3:49 PM 03/18/2022    4:41 PM 12/02/2021   11:13 AM 04/23/2021    9:04 AM 12/01/2020    3:35 PM 12/01/2020    1:41 PM  PHQ 2/9 Scores  PHQ - 2 Score 0 0 0 0 0 0  0    Fall Risk    12/06/2022    8:46 AM 05/03/2022    3:43 PM 03/18/2022    4:41 PM 12/02/2021   11:10 AM 04/23/2021    9:04 AM  Fall Risk   Falls in the past year? 0 0 0 0 0  Number falls in past yr: 0   0   Injury with Fall? 0   0   Risk for fall due to : No Fall Risks   No Fall Risks   Follow up Falls prevention discussed   Falls prevention discussed     FALL RISK PREVENTION PERTAINING TO THE HOME:  Any stairs in or around the home? Yes  If so, are there any without handrails? No  Home free of loose throw rugs in walkways, pet beds, electrical cords, etc? Yes  Adequate lighting in your home to reduce risk of falls? Yes   ASSISTIVE DEVICES UTILIZED TO PREVENT FALLS:  Life alert? No  Use of a cane, walker or w/c? No  Grab bars in the bathroom? No  Shower chair or bench in shower? No  Elevated toilet seat or a handicapped toilet? No          12/06/2022    8:49 AM 12/02/2021   11:18 AM 11/29/2019    2:35 PM  6CIT Screen  What Year? 0 points 0 points 0 points  What month? 0 points 0 points 0 points  What time? 0 points 0 points 0 points  Count back from 20 0 points 0 points 0 points  Months in reverse 0 points 0 points 2 points  Repeat phrase 0 points 0 points 0 points  Total Score 0 points 0 points 2 points    Immunizations Immunization History  Administered Date(s) Administered   Fluad Quad(high Dose 65+) 07/21/2020, 04/23/2021, 05/03/2022   Influenza Inj Mdck Quad Pf 05/10/2017   Influenza, High Dose Seasonal PF 07/28/2020   Influenza,inj,Quad PF,6+ Mos  05/30/2013, 05/27/2015, 06/08/2016, 07/11/2018   Influenza-Unspecified 05/29/2014, 04/14/2019   PFIZER(Purple Top)SARS-COV-2 Vaccination 09/03/2019, 09/24/2019, 05/06/2020, 07/24/2021   Pneumococcal Conjugate-13 07/21/2020   Pneumococcal Polysaccharide-23 10/21/2021   Tdap 07/08/2010, 10/21/2021   Zoster Recombinat (Shingrix) 06/16/2022, 09/09/2022   Zoster, Live 04/03/2013    TDAP status: Up to date  Flu  Vaccine status: Up to date  Pneumococcal vaccine status: Up to date  Covid-19 vaccine status: Completed vaccines  Qualifies for Shingles Vaccine? Yes   Zostavax completed Yes   Shingrix Completed?: Yes  Screening Tests Health Maintenance  Topic Date Due   COVID-19 Vaccine (5 - 2023-24 season) 04/09/2022   INFLUENZA VACCINE  03/10/2023   Medicare Annual Wellness (AWV)  12/06/2023   COLONOSCOPY (Pts 45-26yrs Insurance coverage will need to be confirmed)  10/08/2025   DTaP/Tdap/Td (3 - Td or Tdap) 10/22/2031   Pneumonia Vaccine 58+ Years old  Completed   Hepatitis C Screening  Completed   Zoster Vaccines- Shingrix  Completed   HPV VACCINES  Aged Out   COLON CANCER SCREENING ANNUAL FOBT  Discontinued    Health Maintenance  Health Maintenance Due  Topic Date Due   COVID-19 Vaccine (5 - 2023-24 season) 04/09/2022    Colorectal cancer screening: Type of screening: Colonoscopy. Completed 10/09/2015. Repeat every 10 years  Lung Cancer Screening: (Low Dose CT Chest recommended if Age 43-80 years, 30 pack-year currently smoking OR have quit w/in 15years.) does not qualify.   Lung Cancer Screening Referral: n/a  Additional Screening:  Hepatitis C Screening: does not qualify; Completed 04/04/2020  Vision Screening: Recommended annual ophthalmology exams for early detection of glaucoma and other disorders of the eye. Is the patient up to date with their annual eye exam?  Yes  Who is the provider or what is the name of the office in which the patient attends annual eye exams? Dr.Soneciffer  If pt is not established with a provider, would they like to be referred to a provider to establish care? No .   Dental Screening: Recommended annual dental exams for proper oral hygiene  Community Resource Referral / Chronic Care Management: CRR required this visit?  No   CCM required this visit?  No      Plan:     I have personally reviewed and noted the following in the patient's  chart:   Medical and social history Use of alcohol, tobacco or illicit drugs  Current medications and supplements including opioid prescriptions. Patient is not currently taking opioid prescriptions. Functional ability and status Nutritional status Physical activity Advanced directives List of other physicians Hospitalizations, surgeries, and ER visits in previous 12 months Vitals Screenings to include cognitive, depression, and falls Referrals and appointments  In addition, I have reviewed and discussed with patient certain preventive protocols, quality metrics, and best practice recommendations. A written personalized care plan for preventive services as well as general preventive health recommendations were provided to patient.     Lorrene Reid, LPN   1/61/0960   Nurse Notes: none

## 2022-12-20 ENCOUNTER — Other Ambulatory Visit: Payer: Self-pay | Admitting: Family Medicine

## 2022-12-27 ENCOUNTER — Encounter: Payer: Self-pay | Admitting: Family Medicine

## 2023-02-01 DIAGNOSIS — L821 Other seborrheic keratosis: Secondary | ICD-10-CM | POA: Diagnosis not present

## 2023-02-01 DIAGNOSIS — L570 Actinic keratosis: Secondary | ICD-10-CM | POA: Diagnosis not present

## 2023-02-01 DIAGNOSIS — Z85828 Personal history of other malignant neoplasm of skin: Secondary | ICD-10-CM | POA: Diagnosis not present

## 2023-02-01 DIAGNOSIS — D225 Melanocytic nevi of trunk: Secondary | ICD-10-CM | POA: Diagnosis not present

## 2023-02-01 DIAGNOSIS — C44622 Squamous cell carcinoma of skin of right upper limb, including shoulder: Secondary | ICD-10-CM | POA: Diagnosis not present

## 2023-02-20 ENCOUNTER — Encounter: Payer: Self-pay | Admitting: Family Medicine

## 2023-02-22 ENCOUNTER — Encounter: Payer: Self-pay | Admitting: Family Medicine

## 2023-02-22 DIAGNOSIS — Z716 Tobacco abuse counseling: Secondary | ICD-10-CM

## 2023-02-23 MED ORDER — NICOTINE POLACRILEX 2 MG MT GUM: 2.0000 mg | CHEWING_GUM | OROMUCOSAL | 3 refills | Status: DC | PRN

## 2023-02-28 ENCOUNTER — Ambulatory Visit: Payer: Medicare Other | Admitting: Family Medicine

## 2023-03-14 DIAGNOSIS — M25511 Pain in right shoulder: Secondary | ICD-10-CM | POA: Diagnosis not present

## 2023-03-14 DIAGNOSIS — M25512 Pain in left shoulder: Secondary | ICD-10-CM | POA: Diagnosis not present

## 2023-03-18 ENCOUNTER — Other Ambulatory Visit: Payer: Self-pay | Admitting: Family Medicine

## 2023-03-29 DIAGNOSIS — M13842 Other specified arthritis, left hand: Secondary | ICD-10-CM | POA: Diagnosis not present

## 2023-03-31 ENCOUNTER — Ambulatory Visit: Payer: Medicare Other | Admitting: Family Medicine

## 2023-04-20 ENCOUNTER — Ambulatory Visit: Payer: Medicare Other | Admitting: Family Medicine

## 2023-05-03 ENCOUNTER — Other Ambulatory Visit: Payer: Medicare Other

## 2023-05-03 ENCOUNTER — Other Ambulatory Visit: Payer: Self-pay

## 2023-05-03 DIAGNOSIS — Z136 Encounter for screening for cardiovascular disorders: Secondary | ICD-10-CM | POA: Diagnosis not present

## 2023-05-03 DIAGNOSIS — N400 Enlarged prostate without lower urinary tract symptoms: Secondary | ICD-10-CM

## 2023-05-03 DIAGNOSIS — Z0000 Encounter for general adult medical examination without abnormal findings: Secondary | ICD-10-CM

## 2023-05-04 ENCOUNTER — Ambulatory Visit (INDEPENDENT_AMBULATORY_CARE_PROVIDER_SITE_OTHER): Payer: Medicare Other | Admitting: Family Medicine

## 2023-05-04 ENCOUNTER — Encounter: Payer: Self-pay | Admitting: Family Medicine

## 2023-05-04 VITALS — BP 130/83 | HR 80 | Ht 71.0 in | Wt 225.0 lb

## 2023-05-04 DIAGNOSIS — R350 Frequency of micturition: Secondary | ICD-10-CM

## 2023-05-04 DIAGNOSIS — E7800 Pure hypercholesterolemia, unspecified: Secondary | ICD-10-CM

## 2023-05-04 DIAGNOSIS — N401 Enlarged prostate with lower urinary tract symptoms: Secondary | ICD-10-CM

## 2023-05-04 DIAGNOSIS — R7303 Prediabetes: Secondary | ICD-10-CM

## 2023-05-04 DIAGNOSIS — Z23 Encounter for immunization: Secondary | ICD-10-CM | POA: Diagnosis not present

## 2023-05-04 DIAGNOSIS — Z0001 Encounter for general adult medical examination with abnormal findings: Secondary | ICD-10-CM | POA: Diagnosis not present

## 2023-05-04 DIAGNOSIS — K219 Gastro-esophageal reflux disease without esophagitis: Secondary | ICD-10-CM | POA: Diagnosis not present

## 2023-05-04 DIAGNOSIS — Z0000 Encounter for general adult medical examination without abnormal findings: Secondary | ICD-10-CM

## 2023-05-04 LAB — LIPID PANEL
Chol/HDL Ratio: 4.2 ratio (ref 0.0–5.0)
Cholesterol, Total: 191 mg/dL (ref 100–199)
HDL: 45 mg/dL (ref >39)
LDL Chol Calc (NIH): 104 mg/dL — ABNORMAL HIGH (ref 0–99)
Triglycerides: 243 mg/dL — ABNORMAL HIGH (ref 0–149)
VLDL Cholesterol Cal: 42 mg/dL — ABNORMAL HIGH (ref 5–40)

## 2023-05-04 LAB — CBC WITH DIFFERENTIAL/PLATELET
Basophils Absolute: 0 10*3/uL (ref 0.0–0.2)
Basos: 1 %
EOS (ABSOLUTE): 0 10*3/uL (ref 0.0–0.4)
Eos: 1 %
Hematocrit: 46.1 % (ref 37.5–51.0)
Hemoglobin: 15.7 g/dL (ref 13.0–17.7)
Immature Grans (Abs): 0 10*3/uL (ref 0.0–0.1)
Immature Granulocytes: 1 %
Lymphocytes Absolute: 2.7 10*3/uL (ref 0.7–3.1)
Lymphs: 39 %
MCH: 31.7 pg (ref 26.6–33.0)
MCHC: 34.1 g/dL (ref 31.5–35.7)
MCV: 93 fL (ref 79–97)
Monocytes Absolute: 0.6 10*3/uL (ref 0.1–0.9)
Monocytes: 9 %
Neutrophils Absolute: 3.5 10*3/uL (ref 1.4–7.0)
Neutrophils: 49 %
Platelets: 245 10*3/uL (ref 150–450)
RBC: 4.95 x10E6/uL (ref 4.14–5.80)
RDW: 12.2 % (ref 11.6–15.4)
WBC: 6.8 10*3/uL (ref 3.4–10.8)

## 2023-05-04 LAB — CMP14+EGFR
ALT: 20 IU/L (ref 0–44)
AST: 20 IU/L (ref 0–40)
Albumin: 4.3 g/dL (ref 3.9–4.9)
Alkaline Phosphatase: 71 IU/L (ref 44–121)
BUN/Creatinine Ratio: 11 (ref 10–24)
BUN: 10 mg/dL (ref 8–27)
Bilirubin Total: 0.6 mg/dL (ref 0.0–1.2)
CO2: 23 mmol/L (ref 20–29)
Calcium: 9.4 mg/dL (ref 8.6–10.2)
Chloride: 103 mmol/L (ref 96–106)
Creatinine, Ser: 0.95 mg/dL (ref 0.76–1.27)
Globulin, Total: 2.3 g/dL (ref 1.5–4.5)
Glucose: 104 mg/dL — ABNORMAL HIGH (ref 70–99)
Potassium: 4.6 mmol/L (ref 3.5–5.2)
Sodium: 141 mmol/L (ref 134–144)
Total Protein: 6.6 g/dL (ref 6.0–8.5)
eGFR: 87 mL/min/{1.73_m2} (ref >59)

## 2023-05-04 LAB — VITAMIN D 25 HYDROXY (VIT D DEFICIENCY, FRACTURES): Vit D, 25-Hydroxy: 41.4 ng/mL (ref 30.0–100.0)

## 2023-05-04 LAB — PSA, TOTAL AND FREE
PSA, Free Pct: 29.5 %
PSA, Free: 0.59 ng/mL
Prostate Specific Ag, Serum: 2 ng/mL (ref 0.0–4.0)

## 2023-05-04 MED ORDER — TAMSULOSIN HCL 0.4 MG PO CAPS
0.4000 mg | ORAL_CAPSULE | Freq: Every day | ORAL | 3 refills | Status: DC
Start: 2023-05-04 — End: 2023-09-30

## 2023-05-04 MED ORDER — ROSUVASTATIN CALCIUM 5 MG PO TABS
5.0000 mg | ORAL_TABLET | Freq: Every day | ORAL | 3 refills | Status: DC
Start: 2023-05-04 — End: 2024-05-08

## 2023-05-04 MED ORDER — PANTOPRAZOLE SODIUM 40 MG PO TBEC
40.0000 mg | DELAYED_RELEASE_TABLET | Freq: Every day | ORAL | 0 refills | Status: DC
Start: 2023-05-04 — End: 2023-09-30

## 2023-05-04 MED ORDER — PANTOPRAZOLE SODIUM 40 MG PO TBEC
DELAYED_RELEASE_TABLET | ORAL | 3 refills | Status: DC
Start: 2023-05-04 — End: 2024-05-08

## 2023-05-04 NOTE — Progress Notes (Signed)
BP 130/83   Pulse 80   Ht 5\' 11"  (1.803 m)   Wt 225 lb (102.1 kg)   SpO2 95%   BMI 31.38 kg/m    Subjective:   Patient ID: Michael Clark, male    DOB: 1956-01-02, 67 y.o.   MRN: [id number]  HPI: Michael Clark is a 67 y.o. male presenting on 05/04/2023 for Medical Management of Chronic Issues (CPE)   HPI Physical exam Patient denies any chest pain, shortness of breath, headaches or vision issues, abdominal complaints, diarrhea, nausea, vomiting, or joint issues.   Hyperlipidemia Patient is coming in for recheck of his hyperlipidemia. The patient is currently taking Crestor. They deny any issues with myalgias or history of liver damage from it. They deny any focal numbness or weakness or chest pain.   GERD Patient is currently on pantoprazole.  He is getting some belching and burping and some indigestion. She denies any blood in her stool or lightheadedness or dizziness.   Prediabetes Patient comes in today for recheck of his diabetes. Patient has been currently taking no medicine currently, diet control. Patient is not currently on an ACE inhibitor/ARB. Patient has not seen an ophthalmologist this year. Patient denies any new issues with their feet. The symptom started onset as an adult hyperlipidemia ARE RELATED TO DM and  BPH Patient is coming in for recheck on BPH Symptoms: Urinary frequency and decreased stream Medication: None currently but will start Flomax Last PSA: This week, 2.0  Relevant past medical, surgical, family and social history reviewed and updated as indicated. Interim medical history since our last visit reviewed. Allergies and medications reviewed and updated.  Review of Systems  Constitutional:  Negative for chills and fever.  HENT:  Negative for ear pain and tinnitus.   Eyes:  Negative for pain.  Respiratory:  Negative for cough, shortness of breath and wheezing.   Cardiovascular:  Negative for chest pain, palpitations and leg swelling.   Gastrointestinal:  Negative for abdominal pain, blood in stool, constipation and diarrhea.  Genitourinary:  Negative for dysuria and hematuria.  Musculoskeletal:  Negative for back pain and myalgias.  Skin:  Negative for rash.  Neurological:  Negative for dizziness, weakness and headaches.  Psychiatric/Behavioral:  Negative for suicidal ideas.     Per HPI unless specifically indicated above   Allergies as of 05/04/2023   No Known Allergies      Medication List        Accurate as of May 04, 2023  4:08 PM. If you have any questions, ask your nurse or doctor.          STOP taking these medications    amoxicillin-clavulanate 875-125 MG tablet Commonly known as: AUGMENTIN Stopped by: Elige Radon Chrishonda Hesch   cyanocobalamin 1000 MCG tablet Stopped by: Elige Radon Marlowe Cinquemani       TAKE these medications    cholecalciferol 1000 units tablet Commonly known as: VITAMIN D Take 2,000 Units by mouth daily.   multivitamin with minerals Tabs tablet Take 1 tablet by mouth daily.   nicotine polacrilex 2 MG gum Commonly known as: Nicorette Take 1 each (2 mg total) by mouth as needed for smoking cessation.   pantoprazole 40 MG tablet Commonly known as: PROTONIX TAKE ONE (1) TABLET EACH DAY What changed: Another medication with the same name was added. Make sure you understand how and when to take each. Changed by: Elige Radon Gwenith Tschida   pantoprazole 40 MG tablet Commonly known as: PROTONIX Take 1 tablet (  40 mg total) by mouth at bedtime. Take twice daily for 1 month What changed: You were already taking a medication with the same name, and this prescription was added. Make sure you understand how and when to take each. Changed by: Elige Radon Robynne Roat   rosuvastatin 5 MG tablet Commonly known as: CRESTOR Take 1 tablet (5 mg total) by mouth daily. What changed: See the new instructions. Changed by: Elige Radon Meral Geissinger   tadalafil 10 MG tablet Commonly known as:  CIALIS TAKE 1 TABLET BY MOUTH EVERY OTHER DAY AS NEEDED   tamsulosin 0.4 MG Caps capsule Commonly known as: FLOMAX Take 1 capsule (0.4 mg total) by mouth daily. Started by: Elige Radon Tameeka Luo         Objective:   BP 130/83   Pulse 80   Ht 5\' 11"  (1.803 m)   Wt 225 lb (102.1 kg)   SpO2 95%   BMI 31.38 kg/m   Wt Readings from Last 3 Encounters:  05/04/23 225 lb (102.1 kg)  12/06/22 220 lb (99.8 kg)  05/03/22 225 lb (102.1 kg)    Physical Exam Vitals reviewed.  Constitutional:      General: He is not in acute distress.    Appearance: He is well-developed. He is not diaphoretic.  HENT:     Right Ear: External ear normal.     Left Ear: External ear normal.     Nose: Nose normal.     Mouth/Throat:     Pharynx: No oropharyngeal exudate.  Eyes:     General: No scleral icterus.       Right eye: No discharge.     Conjunctiva/sclera: Conjunctivae normal.     Pupils: Pupils are equal, round, and reactive to light.  Neck:     Thyroid: No thyromegaly.  Cardiovascular:     Rate and Rhythm: Normal rate and regular rhythm.     Heart sounds: Normal heart sounds. No murmur heard. Pulmonary:     Effort: Pulmonary effort is normal. No respiratory distress.     Breath sounds: Normal breath sounds. No wheezing.  Abdominal:     General: Bowel sounds are normal. There is no distension.     Palpations: Abdomen is soft.     Tenderness: There is no abdominal tenderness. There is no guarding or rebound.  Musculoskeletal:        General: No swelling. Normal range of motion.     Cervical back: Neck supple.  Lymphadenopathy:     Cervical: No cervical adenopathy.  Skin:    General: Skin is warm and dry.     Findings: No rash.  Neurological:     Mental Status: He is alert and oriented to person, place, and time.     Coordination: Coordination normal.  Psychiatric:        Behavior: Behavior normal.     Results for orders placed or performed in visit on 05/03/23  PSA, total and  free  Result Value Ref Range   Prostate Specific Ag, Serum 2.0 0.0 - 4.0 ng/mL   PSA, Free 0.59 N/A ng/mL   PSA, Free Pct 29.5 %  VITAMIN D 25 Hydroxy (Vit-D Deficiency, Fractures)  Result Value Ref Range   Vit D, 25-Hydroxy 41.4 30.0 - 100.0 ng/mL  Lipid panel  Result Value Ref Range   Cholesterol, Total 191 100 - 199 mg/dL   Triglycerides 562 (H) 0 - 149 mg/dL   HDL 45 >13 mg/dL   VLDL Cholesterol Cal 42 (H) 5 -  40 mg/dL   LDL Chol Calc (NIH) 413 (H) 0 - 99 mg/dL   Chol/HDL Ratio 4.2 0.0 - 5.0 ratio  CMP14+EGFR  Result Value Ref Range   Glucose 104 (H) 70 - 99 mg/dL   BUN 10 8 - 27 mg/dL   Creatinine, Ser 2.44 0.76 - 1.27 mg/dL   eGFR 87 >01 UU/VOZ/3.66   BUN/Creatinine Ratio 11 10 - 24   Sodium 141 134 - 144 mmol/L   Potassium 4.6 3.5 - 5.2 mmol/L   Chloride 103 96 - 106 mmol/L   CO2 23 20 - 29 mmol/L   Calcium 9.4 8.6 - 10.2 mg/dL   Total Protein 6.6 6.0 - 8.5 g/dL   Albumin 4.3 3.9 - 4.9 g/dL   Globulin, Total 2.3 1.5 - 4.5 g/dL   Bilirubin Total 0.6 0.0 - 1.2 mg/dL   Alkaline Phosphatase 71 44 - 121 IU/L   AST 20 0 - 40 IU/L   ALT 20 0 - 44 IU/L  CBC with Differential/Platelet  Result Value Ref Range   WBC 6.8 3.4 - 10.8 x10E3/uL   RBC 4.95 4.14 - 5.80 x10E6/uL   Hemoglobin 15.7 13.0 - 17.7 g/dL   Hematocrit 44.0 34.7 - 51.0 %   MCV 93 79 - 97 fL   MCH 31.7 26.6 - 33.0 pg   MCHC 34.1 31.5 - 35.7 g/dL   RDW 42.5 95.6 - 38.7 %   Platelets 245 150 - 450 x10E3/uL   Neutrophils 49 Not Estab. %   Lymphs 39 Not Estab. %   Monocytes 9 Not Estab. %   Eos 1 Not Estab. %   Basos 1 Not Estab. %   Neutrophils Absolute 3.5 1.4 - 7.0 x10E3/uL   Lymphocytes Absolute 2.7 0.7 - 3.1 x10E3/uL   Monocytes Absolute 0.6 0.1 - 0.9 x10E3/uL   EOS (ABSOLUTE) 0.0 0.0 - 0.4 x10E3/uL   Basophils Absolute 0.0 0.0 - 0.2 x10E3/uL   Immature Granulocytes 1 Not Estab. %   Immature Grans (Abs) 0.0 0.0 - 0.1 x10E3/uL    Assessment & Plan:   Problem List Items Addressed This Visit        Digestive   GERD (gastroesophageal reflux disease)   Relevant Medications   pantoprazole (PROTONIX) 40 MG tablet   pantoprazole (PROTONIX) 40 MG tablet     Genitourinary   BPH (benign prostatic hyperplasia)   Relevant Medications   tamsulosin (FLOMAX) 0.4 MG CAPS capsule     Other   Hyperlipidemia   Relevant Medications   rosuvastatin (CRESTOR) 5 MG tablet   Prediabetes   Other Visit Diagnoses     Physical exam    -  Primary     Patient's blood sugar is borderline and his cholesterol panel is borderline, we send focus on diet for now.  Follow up plan: Return in about 1 year (around 05/03/2024), or if symptoms worsen or fail to improve, for Physical exam.  Counseling provided for all of the vaccine components No orders of the defined types were placed in this encounter.   Arville Care, MD [facility] 05/04/2023, 4:08 PM

## 2023-08-31 ENCOUNTER — Ambulatory Visit: Payer: Medicare Other | Admitting: Family Medicine

## 2023-08-31 ENCOUNTER — Other Ambulatory Visit: Payer: Self-pay | Admitting: Family Medicine

## 2023-08-31 ENCOUNTER — Encounter: Payer: Self-pay | Admitting: Family Medicine

## 2023-08-31 VITALS — BP 119/69 | HR 77 | Temp 97.4°F | Ht 71.0 in | Wt 225.0 lb

## 2023-08-31 DIAGNOSIS — J40 Bronchitis, not specified as acute or chronic: Secondary | ICD-10-CM | POA: Diagnosis not present

## 2023-08-31 DIAGNOSIS — J329 Chronic sinusitis, unspecified: Secondary | ICD-10-CM

## 2023-08-31 MED ORDER — CLARITHROMYCIN ER 500 MG PO TB24: 1000.0000 mg | ORAL_TABLET | Freq: Every day | ORAL | 0 refills | Status: DC

## 2023-08-31 MED ORDER — MOMETASONE FUROATE 50 MCG/ACT NA SUSP: 2.0000 | Freq: Every day | NASAL | 12 refills | Status: DC

## 2023-08-31 NOTE — Progress Notes (Signed)
Subjective:  Patient ID: Michael Clark, male    DOB: 09/02/1955  Age: 68 y.o. MRN: [id number]  CC: Nasal Congestion (Nasal congestion and productive cough. Started 7 days ago. Treatment: nyquil and afrin.)   HPI Michael Clark presents for cold sx. Cough & chest congestion for a week. Now congested nose and no sinus pressure. Green rhino. Green sputum. No dyspnea. No fever.     05/04/2023    3:44 PM 12/06/2022    8:48 AM 05/03/2022    3:50 PM  Depression screen PHQ 2/9  Decreased Interest 0 0   Down, Depressed, Hopeless 0 0   PHQ - 2 Score 0 0   Altered sleeping 0  0  Tired, decreased energy 0  0  Change in appetite 0  0  Feeling bad or failure about yourself  0  0  Trouble concentrating 0  0  Moving slowly or fidgety/restless 0  0  Suicidal thoughts 0  0  PHQ-9 Score 0    Difficult doing work/chores Not difficult at all  Not difficult at all    History Michael Clark has a past medical history of BPH (benign prostatic hyperplasia), GERD (gastroesophageal reflux disease), Hyperlipidemia, Trigger finger, left, and Vitamin D deficiency.   He has a past surgical history that includes Carpal tunnel release (2006); LEFT THUM PULLEY RELEASE (2006--  DONE W/ CARPAL TUNNEL RELEASE); Appendectomy; Trigger finger release (07/16/2011); Eye surgery (Bilateral); and Extracorporeal shock wave lithotripsy (Left, 03/13/2020).   His family history includes Alzheimer's disease in his mother; Cancer in his father; Diabetes in his father.He reports that he quit smoking about 18 years ago. His smoking use included cigarettes. He has quit using smokeless tobacco. He reports current alcohol use of about 10.0 standard drinks of alcohol per week. He reports that he does not use drugs.    ROS Review of Systems  Constitutional:  Negative for activity change, appetite change, chills and fever.  HENT:  Positive for congestion, postnasal drip, rhinorrhea and sinus pressure. Negative for ear discharge, ear pain,  hearing loss, nosebleeds, sneezing and trouble swallowing.   Respiratory:  Negative for chest tightness and shortness of breath.   Cardiovascular:  Negative for chest pain and palpitations.  Skin:  Negative for rash.    Objective:  BP 119/69   Pulse 77   Temp (!) 97.4 F (36.3 C)   Ht 5\' 11"  (1.803 m)   Wt 225 lb (102.1 kg)   SpO2 97%   BMI 31.38 kg/m   BP Readings from Last 3 Encounters:  08/31/23 119/69  05/04/23 130/83  05/03/22 139/84    Wt Readings from Last 3 Encounters:  08/31/23 225 lb (102.1 kg)  05/04/23 225 lb (102.1 kg)  12/06/22 220 lb (99.8 kg)     Physical Exam Constitutional:      Appearance: He is well-developed.  HENT:     Head: Normocephalic and atraumatic.     Right Ear: Tympanic membrane and external ear normal. No decreased hearing noted.     Left Ear: Tympanic membrane and external ear normal. No decreased hearing noted.     Nose: Mucosal edema present.     Right Sinus: No frontal sinus tenderness.     Left Sinus: No frontal sinus tenderness.     Mouth/Throat:     Pharynx: No oropharyngeal exudate or posterior oropharyngeal erythema.  Neck:     Meningeal: Brudzinski's sign absent.  Pulmonary:     Effort: No respiratory distress.  Breath sounds: Normal breath sounds.  Lymphadenopathy:     Head:     Right side of head: No preauricular adenopathy.     Left side of head: No preauricular adenopathy.     Cervical:     Right cervical: No superficial cervical adenopathy.    Left cervical: No superficial cervical adenopathy.       Assessment & Plan:   Michael Clark was seen today for nasal congestion.  Diagnoses and all orders for this visit:  Sinobronchitis  Other orders -     clarithromycin (BIAXIN XL) 500 MG 24 hr tablet; Take 2 tablets (1,000 mg total) by mouth daily. -     mometasone (NASONEX) 50 MCG/ACT nasal spray; Place 2 sprays into the nose daily.       I am having Michael Clark start on clarithromycin and mometasone. I  am also having him maintain his multivitamin with minerals, cholecalciferol, nicotine polacrilex, tadalafil, pantoprazole, rosuvastatin, tamsulosin, and pantoprazole.  Allergies as of 08/31/2023   No Known Allergies      Medication List        Accurate as of August 31, 2023  8:21 PM. If you have any questions, ask your nurse or doctor.          cholecalciferol 1000 units tablet Commonly known as: VITAMIN D Take 2,000 Units by mouth daily.   clarithromycin 500 MG 24 hr tablet Commonly known as: BIAXIN XL Take 2 tablets (1,000 mg total) by mouth daily. Started by: Mohamad Bruso   mometasone 50 MCG/ACT nasal spray Commonly known as: Nasonex Place 2 sprays into the nose daily. Started by: Broadus John Kyndall Amero   multivitamin with minerals Tabs tablet Take 1 tablet by mouth daily.   nicotine polacrilex 2 MG gum Commonly known as: Nicorette Take 1 each (2 mg total) by mouth as needed for smoking cessation.   pantoprazole 40 MG tablet Commonly known as: PROTONIX TAKE ONE (1) TABLET EACH DAY   pantoprazole 40 MG tablet Commonly known as: PROTONIX Take 1 tablet (40 mg total) by mouth at bedtime. Take twice daily for 1 month   rosuvastatin 5 MG tablet Commonly known as: CRESTOR Take 1 tablet (5 mg total) by mouth daily.   tadalafil 10 MG tablet Commonly known as: CIALIS TAKE 1 TABLET BY MOUTH EVERY OTHER DAY AS NEEDED   tamsulosin 0.4 MG Caps capsule Commonly known as: FLOMAX Take 1 capsule (0.4 mg total) by mouth daily.         Follow-up: Return if symptoms worsen or fail to improve.  Mechele Claude, M.D.

## 2023-09-05 ENCOUNTER — Encounter: Payer: Self-pay | Admitting: Family Medicine

## 2023-09-05 DIAGNOSIS — J329 Chronic sinusitis, unspecified: Secondary | ICD-10-CM

## 2023-09-05 MED ORDER — FLUTICASONE PROPIONATE 50 MCG/ACT NA SUSP: 1.0000 | Freq: Two times a day (BID) | NASAL | 6 refills | Status: AC | PRN

## 2023-09-20 ENCOUNTER — Encounter: Payer: Self-pay | Admitting: Family Medicine

## 2023-09-28 ENCOUNTER — Ambulatory Visit: Payer: Medicare Other | Admitting: Family Medicine

## 2023-09-30 ENCOUNTER — Encounter: Payer: Self-pay | Admitting: Family Medicine

## 2023-09-30 ENCOUNTER — Ambulatory Visit (INDEPENDENT_AMBULATORY_CARE_PROVIDER_SITE_OTHER): Payer: Medicare Other | Admitting: Family Medicine

## 2023-09-30 VITALS — BP 129/73 | HR 77 | Ht 71.0 in | Wt 232.0 lb

## 2023-09-30 DIAGNOSIS — M792 Neuralgia and neuritis, unspecified: Secondary | ICD-10-CM

## 2023-09-30 MED ORDER — GABAPENTIN 100 MG PO CAPS: 100.0000 mg | ORAL_CAPSULE | Freq: Every evening | ORAL | 3 refills | Status: DC | PRN

## 2023-09-30 NOTE — Progress Notes (Signed)
BP 129/73   Pulse 77   Ht 5\' 11"  (1.803 m)   Wt 232 lb (105.2 kg)   SpO2 98%   BMI 32.36 kg/m    Subjective:   Patient ID: Michael Clark, male    DOB: 11-14-1955, 68 y.o.   MRN: [id number]  HPI: Michael Clark is a 68 y.o. male presenting on 09/30/2023 for Foot Pain (Bilateral. Worse at night.)   HPI Bilateral foot pain and some numbness. Patient comes in describing this occasional episodic bilateral foot pain.  He describes this as a stabbing pain that is on both feet and the webbing area between the fourth and fifth toes on both feet.  He also says it feels like a shocking.  It will come on hard and strong for 3 or 4 seconds and then go away and then come back.  He says usually in the past that has occurred over 5 to 10 minutes at nighttime and then it calms down and goes away but more recently he has had a couple episodes over the past 2 months where the stabbing and shocking Returning all night and lasted even 12 hours.  He says he does not have that pain in any of the other times outside of those episodes and cannot recall anything that brought it on or trauma.  He has numbness in that same area.  He gets occasional back pain and was told he had an L4-L5 problem in the past.  Relevant past medical, surgical, family and social history reviewed and updated as indicated. Interim medical history since our last visit reviewed. Allergies and medications reviewed and updated.  Review of Systems  Constitutional:  Negative for chills and fever.  Eyes:  Negative for visual disturbance.  Respiratory:  Negative for shortness of breath and wheezing.   Cardiovascular:  Negative for chest pain and leg swelling.  Musculoskeletal:  Positive for back pain. Negative for gait problem.  Skin:  Negative for rash.  Neurological:  Positive for numbness.  All other systems reviewed and are negative.   Per HPI unless specifically indicated above   Allergies as of 09/30/2023   No Known Allergies       Medication List        Accurate as of September 30, 2023  9:43 AM. If you have any questions, ask your nurse or doctor.          STOP taking these medications    clarithromycin 500 MG 24 hr tablet Commonly known as: BIAXIN XL Stopped by: Elige Radon Jahmad Petrich   mometasone 50 MCG/ACT nasal spray Commonly known as: Nasonex Stopped by: Elige Radon Trezure Cronk   nicotine polacrilex 2 MG gum Commonly known as: Nicorette Stopped by: Elige Radon Sidhant Helderman   tamsulosin 0.4 MG Caps capsule Commonly known as: FLOMAX Stopped by: Elige Radon Aarit Kashuba       TAKE these medications    cholecalciferol 1000 units tablet Commonly known as: VITAMIN D Take 2,000 Units by mouth daily.   fluticasone 50 MCG/ACT nasal spray Commonly known as: FLONASE Place 1 spray into both nostrils 2 (two) times daily as needed for allergies or rhinitis.   gabapentin 100 MG capsule Commonly known as: NEURONTIN Take 1 capsule (100 mg total) by mouth at bedtime as needed. Started by: Elige Radon Chanel Mckesson   multivitamin with minerals Tabs tablet Take 1 tablet by mouth daily.   pantoprazole 40 MG tablet Commonly known as: PROTONIX TAKE ONE (1) TABLET EACH DAY What changed: Another medication  with the same name was removed. Continue taking this medication, and follow the directions you see here. Changed by: Elige Radon Japneet Staggs   rosuvastatin 5 MG tablet Commonly known as: CRESTOR Take 1 tablet (5 mg total) by mouth daily.   tadalafil 10 MG tablet Commonly known as: CIALIS TAKE 1 TABLET BY MOUTH EVERY OTHER DAY AS NEEDED         Objective:   BP 129/73   Pulse 77   Ht 5\' 11"  (1.803 m)   Wt 232 lb (105.2 kg)   SpO2 98%   BMI 32.36 kg/m   Wt Readings from Last 3 Encounters:  09/30/23 232 lb (105.2 kg)  08/31/23 225 lb (102.1 kg)  05/04/23 225 lb (102.1 kg)    Physical Exam Vitals and nursing note reviewed.  Constitutional:      General: He is not in acute distress.    Appearance: He is  well-developed. He is not diaphoretic.  Eyes:     General: No scleral icterus.    Conjunctiva/sclera: Conjunctivae normal.  Neck:     Thyroid: No thyromegaly.  Musculoskeletal:     Cervical back: Neck supple.     Lumbar back: No swelling, spasms, tenderness or bony tenderness. Negative right straight leg raise test and negative left straight leg raise test.     Right foot: Normal range of motion. No deformity.     Left foot: Normal range of motion. No deformity.       Feet:  Feet:     Right foot:     Skin integrity: No skin breakdown, erythema or warmth.     Left foot:     Skin integrity: No skin breakdown, erythema or warmth.  Lymphadenopathy:     Cervical: No cervical adenopathy.  Skin:    General: Skin is warm and dry.     Findings: No rash.  Neurological:     Mental Status: He is alert and oriented to person, place, and time.     Coordination: Coordination normal.  Psychiatric:        Behavior: Behavior normal.       Assessment & Plan:   Problem List Items Addressed This Visit   None Visit Diagnoses       Neuropathic pain    -  Primary   Relevant Medications   gabapentin (NEURONTIN) 100 MG capsule     Sounds like he is having neuropathic pain, likely from nerve impingement in his back.  It only happens occasionally and not that common, will give gabapentin that he can use on those nights when it does happen and if it worsens or happens more frequently then we should go talk to an orthopedic doctor  Encouraged increased activity  Follow up plan: Return if symptoms worsen or fail to improve.  Counseling provided for all of the vaccine components No orders of the defined types were placed in this encounter.   Arville Care, MD [facility] 09/30/2023, 9:43 AM

## 2023-10-09 ENCOUNTER — Other Ambulatory Visit: Payer: Self-pay | Admitting: Family Medicine

## 2023-10-09 DIAGNOSIS — K219 Gastro-esophageal reflux disease without esophagitis: Secondary | ICD-10-CM

## 2023-10-13 ENCOUNTER — Ambulatory Visit: Payer: Medicare Other | Admitting: Family Medicine

## 2023-11-24 ENCOUNTER — Ambulatory Visit (INDEPENDENT_AMBULATORY_CARE_PROVIDER_SITE_OTHER)

## 2023-11-24 ENCOUNTER — Ambulatory Visit: Admitting: Podiatry

## 2023-11-24 ENCOUNTER — Encounter: Payer: Self-pay | Admitting: Podiatry

## 2023-11-24 DIAGNOSIS — M7671 Peroneal tendinitis, right leg: Secondary | ICD-10-CM

## 2023-11-24 DIAGNOSIS — D3610 Benign neoplasm of peripheral nerves and autonomic nervous system, unspecified: Secondary | ICD-10-CM | POA: Diagnosis not present

## 2023-11-24 DIAGNOSIS — M79674 Pain in right toe(s): Secondary | ICD-10-CM

## 2023-11-24 MED ORDER — TRIAMCINOLONE ACETONIDE 10 MG/ML IJ SUSP
10.0000 mg | Freq: Once | INTRAMUSCULAR | Status: AC
  Administered 2023-11-24: 10 mg via INTRA_ARTICULAR

## 2023-11-24 NOTE — Progress Notes (Signed)
 Subjective:   Patient ID: Michael Clark, male   DOB: 67 y.o.   MRN: [id number]   HPI Patient presents with a lot of pain in the outside of the right foot and states that it has been around a month and he does not remember injury.  States he gets very sore and makes it hard for him to be active at times and at times can be throbbing.  Patient does not smoke and likes to be active   Review of Systems  All other systems reviewed and are negative.       Objective:  Physical Exam Vitals and nursing note reviewed.  Constitutional:      Appearance: He is well-developed.  Pulmonary:     Effort: Pulmonary effort is normal.  Musculoskeletal:        General: Normal range of motion.  Skin:    General: Skin is warm.  Neurological:     Mental Status: He is alert.     Neurovascular status intact muscle strength found to be adequate range of motion within normal limits with patient noted to have exquisite discomfort in the lateral side of the right foot in the area of the peroneal insertion.  There is fluid buildup around the area no indication of muscle strength loss or other pathology.  Good digital perfusion well-oriented also complains of occasional shooting pain of the right forefoot with history of back issues     Assessment:  Inflammatory tendinitis of the peroneal at insertion base of fifth metatarsal with fluid buildup and possibility for neuroma symptomatology right     Plan:  H&P reviewed conditions and went ahead today and discussed the nature of the condition.  I went ahead and I did do careful injection after explaining risk of the peroneal sheath and into the insertion base of fifth metatarsal 3 mg dexamethasone Kenalog 5 mg Xylocaine and applied fascial brace to lift up the lateral side of the foot along with aggressive ice therapy.  Patient will be seen back as symptoms indicate may require immobilization and or may require possible MRI  X-rays indicate that there is no signs  of bone structure appears to be a soft tissue pathology

## 2023-12-02 ENCOUNTER — Encounter: Payer: Self-pay | Admitting: Podiatrist

## 2023-12-02 ENCOUNTER — Ambulatory Visit: Admitting: Podiatrist

## 2023-12-02 VITALS — Ht 71.0 in | Wt 232.0 lb

## 2023-12-02 DIAGNOSIS — M7671 Peroneal tendinitis, right leg: Secondary | ICD-10-CM | POA: Diagnosis not present

## 2023-12-02 DIAGNOSIS — S93601A Unspecified sprain of right foot, initial encounter: Secondary | ICD-10-CM

## 2023-12-02 NOTE — Progress Notes (Signed)
  Chief Complaint  Patient presents with   Foot Pain    Patient is here for F/U for right foot pain, patient states pain has not changed     HPI: Patient is 68 y.o. male who presents today for continued right foot pain.  He relates he still has pain on the outside of the foot -  he was doing OK then rolled the foot and feels he may have sprained the tendon causing more pain.     No Known Allergies  Review of systems is negative except as noted in the HPI.  Denies nausea/ vomiting/ fevers/ chills or night sweats.   Denies difficulty breathing, denies calf pain or tenderness  Physical Exam  Patient is awake, alert, and oriented x 3.  In no acute distress.    Vascular status is intact with palpable pedal pulses DP and PT bilateral and capillary refill time less than 3 seconds bilateral.  No edema or erythema noted.   Neurological exam reveals epicritic and protective sensation grossly intact bilateral.   Dermatological exam reveals skin is supple and dry to bilateral feet.  No open lesions present.    Musculoskeletal exam: Musculature intact with dorsiflexion, plantarflexion, inversion, eversion. Continued pain along the peroneal tendon near its insertion site noted.  Some improvement noted with palpation reported prior to turning the foot again. Now the pain has returned. .    Assessment:   ICD-10-CM   1. Peroneal tendinitis, right  M76.71     2. Sprain of right foot, initial encounter  S93.601A        Plan: Recommended an injection to calm the discomfort on the lateral foot-  this was carried out with 10mg  kenalog  and marcaine plain superficial to the peroneal tendon on the lateral aspect of the foot.  An ankle brace was recommended and instructions given for use.  He will see how he does with this injection and will call if it fails to relieve his pain.

## 2023-12-08 NOTE — Progress Notes (Signed)
 This encounter was created in error - please disregard.

## 2023-12-15 DIAGNOSIS — M13842 Other specified arthritis, left hand: Secondary | ICD-10-CM | POA: Diagnosis not present

## 2024-01-29 ENCOUNTER — Encounter: Payer: Self-pay | Admitting: Family Medicine

## 2024-01-30 ENCOUNTER — Ambulatory Visit (INDEPENDENT_AMBULATORY_CARE_PROVIDER_SITE_OTHER): Admitting: Nurse Practitioner

## 2024-01-30 ENCOUNTER — Encounter: Payer: Self-pay | Admitting: Nurse Practitioner

## 2024-01-30 VITALS — BP 130/77 | HR 77 | Temp 98.1°F | Ht 71.0 in | Wt 222.2 lb

## 2024-01-30 DIAGNOSIS — W57XXXA Bitten or stung by nonvenomous insect and other nonvenomous arthropods, initial encounter: Secondary | ICD-10-CM | POA: Diagnosis not present

## 2024-01-30 DIAGNOSIS — S70361A Insect bite (nonvenomous), right thigh, initial encounter: Secondary | ICD-10-CM | POA: Diagnosis not present

## 2024-01-30 MED ORDER — DOXYCYCLINE HYCLATE 100 MG PO CAPS: 200.0000 mg | ORAL_CAPSULE | Freq: Once | ORAL | 0 refills | Status: AC

## 2024-01-30 NOTE — Progress Notes (Signed)
 Acute Office Visit  Subjective:     Patient ID: Michael Clark, male    DOB: [birth year], 68 y.o.   MRN: [id number]  Chief Complaint  Patient presents with  . Tick Removal    Tick bit upper left thigh on Friday. Itchy, red around site    HPI Michael Clark is a 68 year old male presenting on 01/30/2024 for an acute visit with concerns related to a tick bite. He reports he was outside laying mulch and believes that is when he was bitten. He states the tick was attached for >=36 hours and was removed within the past 72 hours. He has a rash on his right wrist at the bite site and has been applying Neosporin. He denies fever, chills, headache, myalgia, or other systemic symptoms at this time.   Active Ambulatory Problems    Diagnosis Date Noted  . GERD (gastroesophageal reflux disease) 10/17/2013  . BPH (benign prostatic hyperplasia) 10/17/2013  . Vitamin D  deficiency 10/17/2013  . Hyperlipidemia 10/17/2013  . Erectile dysfunction 05/29/2015  . Left ureteral stone 12/20/2018  . Aortic atherosclerosis (HCC) 12/20/2018  . 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 Spondylolisthesis, 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  . Prediabetes 05/04/2023  . Tick bite of right thigh 01/30/2024   Resolved Ambulatory Problems    Diagnosis Date Noted  . Bronchospasm 12/20/2018  . Bronchitis 07/28/2020  . Chronic low back pain 12/02/2021   Past Medical History:  Diagnosis Date  . Trigger finger, left     Review of Systems  Skin:  Positive for itching and rash.       Right thigh    Negative unless indicated in HPI    Objective:    BP 130/77   Pulse 77   Temp 98.1 F (36.7 C) (Temporal)   Ht 5' 11 (1.803 m)   Wt 222 lb 3.2 oz (100.8 kg)   SpO2 96%   BMI 30.99 kg/m  BP Readings from Last 3 Encounters:  01/30/24 130/77  09/30/23 129/73  08/31/23 119/69   Wt Readings from Last 3 Encounters:  01/30/24 222 lb 3.2 oz (100.8 kg)  12/02/23 232 lb (105.2 kg)  09/30/23 232 lb (105.2 kg)       Physical Exam Vitals and nursing note reviewed.  Constitutional:      General: He is not in acute distress. HENT:     Head: Normocephalic and atraumatic.     Nose: Nose normal.     Mouth/Throat:     Mouth: Mucous membranes are moist.   Eyes:     General: No scleral icterus.    Extraocular Movements: Extraocular movements intact.     Conjunctiva/sclera: Conjunctivae normal.     Pupils: Pupils are equal, round, and reactive to light.    Cardiovascular:     Heart sounds: Normal heart sounds.  Pulmonary:     Effort: Pulmonary effort is normal.     Breath sounds: Normal breath sounds.   Musculoskeletal:     Right lower leg: No edema.     Left lower leg: No edema.   Skin:    General: Skin is warm and dry.     Findings: Rash present.   Neurological:     Mental Status: He is alert.   Psychiatric:        Mood and Affect: Mood normal.        Behavior: Behavior normal.        Thought Content: Thought content normal.  Judgment: Judgment normal.    No results found for any visits on 01/30/24.      Assessment & Plan:  Tick bite of right thigh, initial encounter -     Lyme Disease Serology w/Reflex -     Alpha-Gal Panel -     Doxycycline  Hyclate; Take 2 capsules (200 mg total) by mouth once for 1 dose.  Dispense: 14 capsule; Refill: 0   Michael Clark is 68 year old Caucasian male seen today for teen bite, no acute distress Since tick was attacked for > 36 hrs  Prophylaxis tx  with doxycycline  200 mg single dose, avoid direct sunlight Labs: Lyme serology, alpha gal panel result pending  Patient Education:  Tick Bite Care: Keep the bite areas clean and dry. You may apply an antibiotic ointment (like Neosporin) to reduce the risk of local infection. -Watch for signs of localized infection such as increased redness, warmth, swelling, or pus.  Signs and Symptoms of Tickborne Illness: -Be alert for neurological symptoms such as facial droop, dizziness, or  numbness. -Pt to report any new fever, joint pain, or rash -Wear protective clothing while outside- Long sleeves and long pants -Put insect repellent on all exposed skin and along clothing -Take a shower as soon as possible after being outside   The above assessment and management plan was discussed with the patient. The patient verbalized understanding of and has agreed to the management plan. Patient is aware to call the clinic if they develop any new symptoms or if symptoms persist or worsen. Patient is aware when to return to the clinic for a follow-up visit. Patient educated on when it is appropriate to go to the emergency department.  Return if symptoms worsen or fail to improve.  Michael Granlund [place] Thompson, DNP [facility] [address] [place], [place] [address] [phone]  Note: This document was prepared by [employer] voice dictation technology and any errors that results from this process are unintentional.

## 2024-02-01 LAB — ALPHA-GAL PANEL: IgE (Immunoglobulin E), Serum: 18 [IU]/mL (ref 6–495)

## 2024-02-01 LAB — LYME DISEASE SEROLOGY W/REFLEX: Lyme Total Antibody EIA: NEGATIVE

## 2024-02-02 ENCOUNTER — Ambulatory Visit: Payer: Self-pay | Admitting: Nurse Practitioner

## 2024-02-02 DIAGNOSIS — L720 Epidermal cyst: Secondary | ICD-10-CM | POA: Diagnosis not present

## 2024-02-02 DIAGNOSIS — L821 Other seborrheic keratosis: Secondary | ICD-10-CM | POA: Diagnosis not present

## 2024-02-02 DIAGNOSIS — D1801 Hemangioma of skin and subcutaneous tissue: Secondary | ICD-10-CM | POA: Diagnosis not present

## 2024-02-02 DIAGNOSIS — L570 Actinic keratosis: Secondary | ICD-10-CM | POA: Diagnosis not present

## 2024-02-02 DIAGNOSIS — L814 Other melanin hyperpigmentation: Secondary | ICD-10-CM | POA: Diagnosis not present

## 2024-02-02 DIAGNOSIS — L500 Allergic urticaria: Secondary | ICD-10-CM | POA: Diagnosis not present

## 2024-02-02 DIAGNOSIS — Z85828 Personal history of other malignant neoplasm of skin: Secondary | ICD-10-CM | POA: Diagnosis not present

## 2024-05-02 ENCOUNTER — Other Ambulatory Visit: Payer: Self-pay

## 2024-05-02 ENCOUNTER — Encounter: Payer: Self-pay | Admitting: Family Medicine

## 2024-05-02 DIAGNOSIS — R7303 Prediabetes: Secondary | ICD-10-CM

## 2024-05-02 DIAGNOSIS — N400 Enlarged prostate without lower urinary tract symptoms: Secondary | ICD-10-CM

## 2024-05-02 DIAGNOSIS — E7800 Pure hypercholesterolemia, unspecified: Secondary | ICD-10-CM

## 2024-05-02 DIAGNOSIS — E559 Vitamin D deficiency, unspecified: Secondary | ICD-10-CM

## 2024-05-04 ENCOUNTER — Encounter: Payer: Medicare Other | Admitting: Family Medicine

## 2024-05-07 ENCOUNTER — Encounter: Payer: Self-pay | Admitting: Family Medicine

## 2024-05-08 ENCOUNTER — Other Ambulatory Visit

## 2024-05-09 ENCOUNTER — Other Ambulatory Visit

## 2024-05-09 ENCOUNTER — Ambulatory Visit

## 2024-05-09 VITALS — BP 130/77 | HR 77 | Ht 71.0 in | Wt 222.0 lb

## 2024-05-09 DIAGNOSIS — N400 Enlarged prostate without lower urinary tract symptoms: Secondary | ICD-10-CM

## 2024-05-09 DIAGNOSIS — R7303 Prediabetes: Secondary | ICD-10-CM | POA: Diagnosis not present

## 2024-05-09 DIAGNOSIS — Z0000 Encounter for general adult medical examination without abnormal findings: Secondary | ICD-10-CM

## 2024-05-09 DIAGNOSIS — E7800 Pure hypercholesterolemia, unspecified: Secondary | ICD-10-CM | POA: Diagnosis not present

## 2024-05-09 DIAGNOSIS — E559 Vitamin D deficiency, unspecified: Secondary | ICD-10-CM | POA: Diagnosis not present

## 2024-05-09 LAB — LIPID PANEL

## 2024-05-09 NOTE — Patient Instructions (Signed)
 [name],  Thank you for taking the time for your Medicare Wellness Visit. I appreciate your continued commitment to your health goals. Please review the care plan we discussed, and feel free to reach out if I can assist you further.  Medicare recommends these wellness visits once per year to help you and your care team stay ahead of potential health issues. These visits are designed to focus on prevention, allowing your provider to concentrate on managing your acute and chronic conditions during your regular appointments.  Please note that Annual Wellness Visits do not include a physical exam. Some assessments may be limited, especially if the visit was conducted virtually. If needed, we may recommend a separate in-person follow-up with your provider.  Ongoing Care Seeing your primary care provider every 3 to 6 months helps us  monitor your health and provide consistent, personalized care.   Referrals If a referral was made during today's visit and you haven't received any updates within two weeks, please contact the referred provider directly to check on the status.  Recommended Screenings:  Health Maintenance  Topic Date Due   Medicare Annual Wellness Visit  12/06/2023   Flu Shot  03/09/2024   COVID-19 Vaccine (5 - 2025-26 season) 04/09/2024   Colon Cancer Screening  10/08/2025   DTaP/Tdap/Td vaccine (3 - Td or Tdap) 10/22/2031   Pneumococcal Vaccine for age over 74  Completed   Hepatitis C Screening  Completed   Zoster (Shingles) Vaccine  Completed   HPV Vaccine  Aged Out   Meningitis B Vaccine  Aged Out   Stool Blood Test  Discontinued       12/06/2022    8:49 AM  Advanced Directives  Does Patient Have a Medical Advance Directive? Yes  Type of Estate agent of [place];Living will  Copy of Healthcare Power of Attorney in Chart? No - copy requested   Advance Care Planning is important because it: Ensures you receive medical care that aligns with your  values, goals, and preferences. Provides guidance to your family and loved ones, reducing the emotional burden of decision-making during critical moments.  Vision: Annual vision screenings are recommended for early detection of glaucoma, cataracts, and diabetic retinopathy. These exams can also reveal signs of chronic conditions such as diabetes and high blood pressure.  Dental: Annual dental screenings help detect early signs of oral cancer, gum disease, and other conditions linked to overall health, including heart disease and diabetes.  Please see the attached documents for additional preventive care recommendations.

## 2024-05-09 NOTE — Progress Notes (Signed)
 Subjective:   Michael Clark is a 68 y.o. who presents for a Medicare Wellness preventive visit.  As a reminder, Annual Wellness Visits don't include a physical exam, and some assessments may be limited, especially if this visit is performed virtually. We may recommend an in-person follow-up visit with your provider if needed.  Visit Complete: Virtual I connected with  LIBRADO GUANDIQUE on 05/09/24 by a audio enabled telemedicine application and verified that I am speaking with the correct person using two identifiers.  Patient Location: Home  Provider Location: Home Office  I discussed the limitations of evaluation and management by telemedicine. The patient expressed understanding and agreed to proceed.  Vital Signs: Because this visit was a virtual/telehealth visit, some criteria may be missing or patient reported. Any vitals not documented were not able to be obtained and vitals that have been documented are patient reported.  VideoDeclined- This patient declined Librarian, academic. Therefore the visit was completed with audio only.  Persons Participating in Visit: Patient.  AWV Questionnaire: Yes: Patient Medicare AWV questionnaire was completed by the patient on 05/08/24; I have confirmed that all information answered by patient is correct and no changes since this date.  Cardiac Risk Factors include: advanced age (>32men, >69 women);sedentary lifestyle     Objective:    Today's Vitals   05/09/24 1341  BP: 130/77  Pulse: 77  Weight: 222 lb (100.7 kg)  Height: 5' 11 (1.803 m)   Body mass index is 30.96 kg/m.     05/09/2024    1:50 PM 12/06/2022    8:49 AM 09/06/2022   12:38 PM 12/02/2021   11:17 AM 12/01/2020    3:35 PM 12/01/2020    1:43 PM 03/13/2020    7:16 AM  Advanced Directives  Does Patient Have a Medical Advance Directive? No Yes Yes Yes Yes Yes Yes  Type of Furniture conservator/restorer;Living will  Healthcare Power  of [place];Living will Healthcare Power of [place];Living will Healthcare Power of [place];Living will Healthcare Power of [place];Living will  Copy of Healthcare Power of Attorney in Chart?  No - copy requested  No - copy requested No - copy requested No - copy requested     Current Medications (verified) Outpatient Encounter Medications as of 05/09/2024  Medication Sig   cholecalciferol (VITAMIN D ) 1000 units tablet Take 2,000 Units by mouth daily.    fluticasone  (FLONASE ) 50 MCG/ACT nasal spray Place 1 spray into both nostrils 2 (two) times daily as needed for allergies or rhinitis.   gabapentin  (NEURONTIN ) 100 MG capsule Take 1 capsule (100 mg total) by mouth at bedtime as needed.   Multiple Vitamin (MULTIVITAMIN WITH MINERALS) TABS tablet Take 1 tablet by mouth daily.   pantoprazole  (PROTONIX ) 40 MG tablet TAKE ONE (1) TABLET EACH DAY   rosuvastatin  (CRESTOR ) 5 MG tablet Take 1 tablet (5 mg total) by mouth daily.   tadalafil  (CIALIS ) 10 MG tablet TAKE 1 TABLET BY MOUTH EVERY OTHER DAY AS NEEDED   No facility-administered encounter medications on file as of 05/09/2024.    Allergies (verified) Patient has no known allergies.   History: Past Medical History:  Diagnosis Date   BPH (benign prostatic hyperplasia)    GERD (gastroesophageal reflux disease)    Hyperlipidemia    Trigger finger, left    Vitamin D  deficiency    Past Surgical History:  Procedure Laterality Date   APPENDECTOMY     CARPAL TUNNEL RELEASE  2006  LEFT    EXTRACORPOREAL SHOCK WAVE LITHOTRIPSY Left 03/13/2020   Procedure: EXTRACORPOREAL SHOCK WAVE LITHOTRIPSY (ESWL);  Surgeon: Watt Rush, MD;  Location: [facility];  Service: Urology;  Laterality: Left;   EYE SURGERY Bilateral    cataracts   LEFT THUM PULLEY RELEASE  2006--  DONE W/ CARPAL TUNNEL RELEASE   TRIGGER FINGER RELEASE  07/16/2011   Procedure: RELEASE TRIGGER FINGER/A-1 PULLEY;  Surgeon: Lynwood SQUIBB [place];  [facility];  Service: Orthopedics;  Laterality: Left;   Family History  Problem Relation Age of Onset   Alzheimer's disease Mother    Cancer Father        prostate   Diabetes Father    Social History   Socioeconomic History   Marital status: Married    Spouse name: Not on file   Number of children: 2   Years of education: 12   Highest education level: Associate degree: academic program  Occupational History    Comment: retired  Tobacco Use   Smoking status: Former    Current packs/day: 0.00    Types: Cigarettes    Quit date: 10/17/2004    Years since quitting: 19.5   Smokeless tobacco: Former  Building services engineer status: Never Used  Substance and Sexual Activity   Alcohol use: Yes    Alcohol/week: 10.0 standard drinks of alcohol    Types: 10 Cans of beer per week   Drug use: Never   Sexual activity: Yes    Birth control/protection: Surgical  Other Topics Concern   Not on file  Social History Narrative   Lives with his wife   Social Drivers of Health   Financial Resource Strain: Low Risk  (05/08/2024)   Overall Financial Resource Strain (CARDIA)    Difficulty of Paying Living Expenses: Not hard at all  Food Insecurity: No Food Insecurity (05/08/2024)   Hunger Vital Sign    Worried About Running Out of Food in the Last Year: Never true    Ran Out of Food in the Last Year: Never true  Transportation Needs: No Transportation Needs (05/09/2024)   PRAPARE - Administrator, Civil Service (Medical): No    Lack of Transportation (Non-Medical): No  Physical Activity: Insufficiently Active (05/08/2024)   Exercise Vital Sign    Days of Exercise per Week: 2 days    Minutes of Exercise per Session: 30 min  Stress: Stress Concern Present (05/08/2024)   [employer] of Occupational Health - Occupational Stress Questionnaire    Feeling of Stress: Very much  Social Connections: Moderately Integrated (05/08/2024)   Social Connection and Isolation Panel     Frequency of Communication with Friends and Family: More than three times a week    Frequency of Social Gatherings with Friends and Family: Three times a week    Attends Religious Services: More than 4 times per year    Active Member of Clubs or Organizations: No    Attends Engineer, structural: Not on file    Marital Status: Married    Tobacco Counseling Counseling given: Yes    Clinical Intake:  Pre-visit preparation completed: Yes  Pain : No/denies pain     BMI - recorded: 30.96 Nutritional Status: BMI > 30  Obese Nutritional Risks: None Diabetes: No  No results found for: HGBA1C   How often do you need to have someone help you when you read instructions, pamphlets, or other written materials from your doctor or pharmacy?:  1 - Never  Interpreter Needed?: No  Information entered by :: alia t/cma   Activities of Daily Living     05/09/2024    1:44 PM 05/08/2024    9:01 AM  In your present state of health, do you have any difficulty performing the following activities:  Hearing? 0 0  Vision? 0 0  Difficulty concentrating or making decisions? 0 0  Walking or climbing stairs? 0 0  Dressing or bathing? 0 0  Doing errands, shopping? 0 0  Preparing Food and eating ? N N  Using the Toilet? N N  In the past six months, have you accidently leaked urine? Y Y  Do you have problems with loss of bowel control? N N  Managing your Medications? N N  Managing your Finances? N N  Housekeeping or managing your Housekeeping? N N    Patient Care Team: Dettinger, Fonda LABOR, MD as PCP - General (Family Medicine) Lynnell Nottingham, MD as Consulting Physician (Dermatology) Lanis Pupa, MD as Consulting Physician (Neurosurgery)  I have updated your Care Teams any recent Medical Services you may have received from other providers in the past year.     Assessment:   This is a routine wellness examination for Michael Clark.  Hearing/Vision screen Hearing Screening -  Comments:: Pt have some hearing dif Vision Screening - Comments:: Pt wear glasses for reading/Dr. Stonecipher/couple yrs   Goals Addressed             This Visit's Progress    Increase physical activity   On track    Lose weight and get more active and healthier Goals Addressed             This Visit's Progress    Increase physical activity       Lose weight and get more active and healthier              Depression Screen     05/09/2024    1:46 PM 09/30/2023    9:19 AM 05/04/2023    3:44 PM 12/06/2022    8:48 AM 05/03/2022    3:49 PM 03/18/2022    4:41 PM 12/02/2021   11:13 AM  PHQ 2/9 Scores  PHQ - 2 Score 1 0 0 0 0 0 0  PHQ- 9 Score   0        Fall Risk     05/09/2024    1:43 PM 05/08/2024    9:01 AM 09/30/2023    9:18 AM 05/04/2023    3:44 PM 12/06/2022    8:46 AM  Fall Risk   Falls in the past year? 0 0 0 0 0  Number falls in past yr: 0    0  Injury with Fall? 0    0  Risk for fall due to : No Fall Risks    No Fall Risks  Follow up Falls evaluation completed    Falls prevention discussed    MEDICARE RISK AT HOME:  Medicare Risk at Home Any stairs in or around the home?: (Patient-Rptd) Yes If so, are there any without handrails?: (Patient-Rptd) No Home free of loose throw rugs in walkways, pet beds, electrical cords, etc?: (Patient-Rptd) Yes Adequate lighting in your home to reduce risk of falls?: (Patient-Rptd) Yes Life alert?: (Patient-Rptd) No Use of a cane, walker or w/c?: (Patient-Rptd) No Grab bars in the bathroom?: (Patient-Rptd) No Shower chair or bench in shower?: (Patient-Rptd) No Elevated toilet seat or a handicapped toilet?: (Patient-Rptd) No  TIMED UP  AND GO:  Was the test performed?  no  Cognitive Function: 6CIT completed        05/09/2024    1:47 PM 12/06/2022    8:49 AM 12/02/2021   11:18 AM 11/29/2019    2:35 PM  6CIT Screen  What Year? 0 points 0 points 0 points 0 points  What month? 0 points 0 points 0 points 0 points  What  time? 0 points 0 points 0 points 0 points  Count back from 20 0 points 0 points 0 points 0 points  Months in reverse 0 points 0 points 0 points 2 points  Repeat phrase 0 points 0 points 0 points 0 points  Total Score 0 points 0 points 0 points 2 points    Immunizations Immunization History  Administered Date(s) Administered   Fluad Quad(high Dose 65+) 07/21/2020, 04/23/2021, 05/03/2022   Fluad Trivalent(High Dose 65+) 05/04/2023   INFLUENZA, HIGH DOSE SEASONAL PF 07/28/2020   Influenza Inj Mdck Quad Pf 05/10/2017   Influenza,inj,Quad PF,6+ Mos 05/30/2013, 05/27/2015, 06/08/2016, 07/11/2018   Influenza-Unspecified 05/29/2014, 04/14/2019   PFIZER(Purple Top)SARS-COV-2 Vaccination 09/03/2019, 09/24/2019, 05/06/2020, 07/24/2021   Pneumococcal Conjugate-13 07/21/2020   Pneumococcal Polysaccharide-23 10/21/2021   Tdap 07/08/2010, 10/21/2021   Zoster Recombinant(Shingrix ) 06/16/2022, 09/09/2022   Zoster, Live 04/03/2013    Screening Tests Health Maintenance  Topic Date Due   Influenza Vaccine  03/09/2024   COVID-19 Vaccine (5 - 2025-26 season) 04/09/2024   Medicare Annual Wellness (AWV)  05/09/2025   Colonoscopy  10/08/2025   DTaP/Tdap/Td (3 - Td or Tdap) 10/22/2031   Pneumococcal Vaccine: 50+ Years  Completed   Hepatitis C Screening  Completed   Zoster Vaccines- Shingrix   Completed   HPV VACCINES  Aged Out   Meningococcal B Vaccine  Aged Out   COLON CANCER SCREENING ANNUAL FOBT  Discontinued    Health Maintenance Items Addressed: See Nurse Notes at the end of this note  Additional Screening:  Vision Screening: Recommended annual ophthalmology exams for early detection of glaucoma and other disorders of the eye. Is the patient up to date with their annual eye exam?  Yes  Who is the provider or what is the name of the office in which the patient attends annual eye exams? Dr. Meridee  Dental Screening: Recommended annual dental exams for proper oral hygiene  Community  Resource Referral / Chronic Care Management: CRR required this visit?  No   CCM required this visit?  No   Plan:    I have personally reviewed and noted the following in the patient's chart:   Medical and social history Use of alcohol, tobacco or illicit drugs  Current medications and supplements including opioid prescriptions. Patient is not currently taking opioid prescriptions. Functional ability and status Nutritional status Physical activity Advanced directives List of other physicians Hospitalizations, surgeries, and ER visits in previous 12 months Vitals Screenings to include cognitive, depression, and falls Referrals and appointments  In addition, I have reviewed and discussed with patient certain preventive protocols, quality metrics, and best practice recommendations. A written personalized care plan for preventive services as well as general preventive health recommendations were provided to patient.   Ozie Ned, CMA   05/09/2024   After Visit Summary: (MyChart) Due to this being a telephonic visit, the after visit summary with patients personalized plan was offered to patient via MyChart   Notes: Nothing significant to report at this time.

## 2024-05-10 ENCOUNTER — Encounter: Payer: Self-pay | Admitting: Family Medicine

## 2024-05-10 ENCOUNTER — Ambulatory Visit (INDEPENDENT_AMBULATORY_CARE_PROVIDER_SITE_OTHER): Admitting: Family Medicine

## 2024-05-10 VITALS — BP 125/79 | HR 83 | Temp 98.0°F | Ht 71.0 in | Wt 213.0 lb

## 2024-05-10 DIAGNOSIS — N5201 Erectile dysfunction due to arterial insufficiency: Secondary | ICD-10-CM

## 2024-05-10 DIAGNOSIS — E7800 Pure hypercholesterolemia, unspecified: Secondary | ICD-10-CM

## 2024-05-10 DIAGNOSIS — K219 Gastro-esophageal reflux disease without esophagitis: Secondary | ICD-10-CM

## 2024-05-10 DIAGNOSIS — Z0001 Encounter for general adult medical examination with abnormal findings: Secondary | ICD-10-CM

## 2024-05-10 DIAGNOSIS — Z23 Encounter for immunization: Secondary | ICD-10-CM

## 2024-05-10 DIAGNOSIS — R7303 Prediabetes: Secondary | ICD-10-CM

## 2024-05-10 DIAGNOSIS — N400 Enlarged prostate without lower urinary tract symptoms: Secondary | ICD-10-CM

## 2024-05-10 DIAGNOSIS — F419 Anxiety disorder, unspecified: Secondary | ICD-10-CM

## 2024-05-10 DIAGNOSIS — I700 Atherosclerosis of aorta: Secondary | ICD-10-CM | POA: Diagnosis not present

## 2024-05-10 LAB — CBC WITH DIFFERENTIAL/PLATELET
Basophils Absolute: 0 x10E3/uL (ref 0.0–0.2)
Basos: 1 %
EOS (ABSOLUTE): 0.1 x10E3/uL (ref 0.0–0.4)
Eos: 2 %
Hematocrit: 48.3 % (ref 37.5–51.0)
Hemoglobin: 16 g/dL (ref 13.0–17.7)
Immature Grans (Abs): 0 x10E3/uL (ref 0.0–0.1)
Immature Granulocytes: 0 %
Lymphocytes Absolute: 3 x10E3/uL (ref 0.7–3.1)
Lymphs: 44 %
MCH: 31.1 pg (ref 26.6–33.0)
MCHC: 33.1 g/dL (ref 31.5–35.7)
MCV: 94 fL (ref 79–97)
Monocytes Absolute: 0.6 x10E3/uL (ref 0.1–0.9)
Monocytes: 10 %
Neutrophils Absolute: 2.7 x10E3/uL (ref 1.4–7.0)
Neutrophils: 42 %
Platelets: 264 x10E3/uL (ref 150–450)
RBC: 5.14 x10E6/uL (ref 4.14–5.80)
RDW: 12.2 % (ref 11.6–15.4)
WBC: 6.4 x10E3/uL (ref 3.4–10.8)

## 2024-05-10 LAB — CMP14+EGFR
ALT: 26 IU/L (ref 0–44)
AST: 25 IU/L (ref 0–40)
Albumin: 4.3 g/dL (ref 3.9–4.9)
Alkaline Phosphatase: 85 IU/L (ref 47–123)
BUN/Creatinine Ratio: 10 (ref 10–24)
BUN: 11 mg/dL (ref 8–27)
Bilirubin Total: 0.9 mg/dL (ref 0.0–1.2)
CO2: 22 mmol/L (ref 20–29)
Calcium: 9.6 mg/dL (ref 8.6–10.2)
Chloride: 104 mmol/L (ref 96–106)
Creatinine, Ser: 1.08 mg/dL (ref 0.76–1.27)
Globulin, Total: 2.2 g/dL (ref 1.5–4.5)
Glucose: 107 mg/dL — ABNORMAL HIGH (ref 70–99)
Potassium: 5 mmol/L (ref 3.5–5.2)
Sodium: 142 mmol/L (ref 134–144)
Total Protein: 6.5 g/dL (ref 6.0–8.5)
eGFR: 74 mL/min/1.73 (ref >59)

## 2024-05-10 LAB — PSA, TOTAL AND FREE
PSA, Free Pct: 27.5 %
PSA, Free: 0.66 ng/mL
Prostate Specific Ag, Serum: 2.4 ng/mL (ref 0.0–4.0)

## 2024-05-10 LAB — LIPID PANEL
Cholesterol, Total: 161 mg/dL (ref 100–199)
HDL: 50 mg/dL (ref >39)
LDL CALC COMMENT:: 3.2 ratio (ref 0.0–5.0)
LDL Chol Calc (NIH): 86 mg/dL (ref 0–99)
Triglycerides: 140 mg/dL (ref 0–149)
VLDL Cholesterol Cal: 25 mg/dL (ref 5–40)

## 2024-05-10 LAB — TSH: TSH: 1.5 u[IU]/mL (ref 0.450–4.500)

## 2024-05-10 LAB — VITAMIN D 25 HYDROXY (VIT D DEFICIENCY, FRACTURES): Vit D, 25-Hydroxy: 42.7 ng/mL (ref 30.0–100.0)

## 2024-05-10 MED ORDER — TADALAFIL 10 MG PO TABS: 10.0000 mg | ORAL_TABLET | Freq: Every day | ORAL | 2 refills | Status: AC | PRN

## 2024-05-10 MED ORDER — ROSUVASTATIN CALCIUM 5 MG PO TABS: 5.0000 mg | ORAL_TABLET | Freq: Every day | ORAL | 3 refills | Status: AC

## 2024-05-10 MED ORDER — PANTOPRAZOLE SODIUM 40 MG PO TBEC: DELAYED_RELEASE_TABLET | ORAL | 3 refills | Status: AC

## 2024-05-10 MED ORDER — BUSPIRONE HCL 10 MG PO TABS: 10.0000 mg | ORAL_TABLET | Freq: Two times a day (BID) | ORAL | 5 refills | Status: DC

## 2024-05-10 NOTE — Progress Notes (Signed)
 BP 125/79   Pulse 83   Temp 98 F (36.7 C) (Temporal)   Ht 5' 11 (1.803 m)   Wt 213 lb (96.6 kg)   SpO2 97%   BMI 29.71 kg/m    Subjective:   Patient ID: Michael Clark, male    DOB: 1956/03/06, 68 y.o.   MRN: [id number]  HPI: Michael Clark is a 68 y.o. male presenting on 05/10/2024 for Annual Exam   Discussed the use of AI scribe software for clinical note transcription with the patient, who gave verbal consent to proceed.  History of Present Illness   Michael Clark is a 68 year old male with hyperlipidemia, prediabetes, GERD, aortic atherosclerosis, and BPH who presents for a physical exam and recheck of chronic medical issues.  Hyperlipidemia - Managed with rosuvastatin  (Crestor ) - No medication side effects - Cholesterol levels well-controlled - Satisfied with current management  Gastroesophageal reflux disease (gerd) - Uses medication for symptom control - No current reflux symptoms  Benign prostatic hyperplasia (bph) and lower urinary tract symptoms - Occasional urinary urgency, particularly after alcohol consumption - Nocturia present but manageable - Prefers to avoid Flomax  due to potential sexual side effects  Anxiety - Anxiety present for the past two months, attributed to home-related stressors - Tried B6 and magnesium  supplements with uncertain efficacy - Interested in non-prescription management options  Unintentional weight loss and appetite changes - Weight loss of approximately fifteen pounds over the last three to four months - Decreased appetite, possibly related to anxiety - No gastrointestinal symptoms - Less regular bowel movements - Considering fiber supplementation  Easy bruising and skin changes - Thin skin with easy bruising, attributed to aging - Considering increased protein intake  Nasal congestion - Nasal congestion, especially at night - Resumed use of Flonase  and AstiPro - Attempting to reduce Afrin use due to concerns  about rebound congestion          Relevant past medical, surgical, family and social history reviewed and updated as indicated. Interim medical history since our last visit reviewed. Allergies and medications reviewed and updated.  Review of Systems  Constitutional:  Negative for chills and fever.  HENT:  Negative for ear pain and tinnitus.   Eyes:  Negative for pain and discharge.  Respiratory:  Negative for cough, shortness of breath and wheezing.   Cardiovascular:  Negative for chest pain, palpitations and leg swelling.  Gastrointestinal:  Negative for abdominal pain, blood in stool, constipation and diarrhea.  Genitourinary:  Positive for frequency. Negative for dysuria and hematuria.  Musculoskeletal:  Negative for back pain, gait problem and myalgias.  Skin:  Negative for rash.  Neurological:  Positive for numbness. Negative for dizziness, weakness and headaches.  Psychiatric/Behavioral:  Positive for dysphoric mood. Negative for suicidal ideas. The patient is nervous/anxious.   All other systems reviewed and are negative.   Per HPI unless specifically indicated above   Allergies as of 05/10/2024   No Known Allergies      Medication List        Accurate as of May 10, 2024 10:24 AM. If you have any questions, ask your nurse or doctor.          STOP taking these medications    gabapentin  100 MG capsule Commonly known as: NEURONTIN  Stopped by: Fonda LABOR Glendal Cassaday       TAKE these medications    B-6 PO Take by mouth.   busPIRone  10 MG tablet Commonly known as: BUSPAR  Take 1  tablet (10 mg total) by mouth 2 (two) times daily. Started by: Epimenio Schetter A Kamaria Lucia   cholecalciferol 1000 units tablet Commonly known as: VITAMIN D  Take 2,000 Units by mouth daily.   fluticasone  50 MCG/ACT nasal spray Commonly known as: FLONASE  Place 1 spray into both nostrils 2 (two) times daily as needed for allergies or rhinitis.   MAGNESIUM  PO Take by mouth.    multivitamin with minerals Tabs tablet Take 1 tablet by mouth daily.   pantoprazole  40 MG tablet Commonly known as: PROTONIX  TAKE ONE (1) TABLET EACH DAY   rosuvastatin  5 MG tablet Commonly known as: CRESTOR  Take 1 tablet (5 mg total) by mouth daily.   tadalafil  10 MG tablet Commonly known as: CIALIS  Take 1 tablet (10 mg total) by mouth daily as needed for erectile dysfunction. What changed: See the new instructions. Changed by: Fonda LABOR Tyeshia Cornforth         Objective:   BP 125/79   Pulse 83   Temp 98 F (36.7 C) (Temporal)   Ht 5' 11 (1.803 m)   Wt 213 lb (96.6 kg)   SpO2 97%   BMI 29.71 kg/m   Wt Readings from Last 3 Encounters:  05/10/24 213 lb (96.6 kg)  05/09/24 222 lb (100.7 kg)  01/30/24 222 lb 3.2 oz (100.8 kg)    Physical Exam Physical Exam   HEENT: Oral cavity normal. CHEST: Lungs clear to auscultation. CARDIOVASCULAR: Regular heart sounds, heart rate normal, pulses normal, no swelling. ABDOMEN: No costovertebral angle tenderness. NEUROLOGICAL: Reflexes normal in both legs.       Results for orders placed or performed in visit on 05/09/24  VITAMIN D  25 Hydroxy (Vit-D Deficiency, Fractures)   Collection Time: 05/09/24  8:57 AM  Result Value Ref Range   Vit D, 25-Hydroxy 42.7 30.0 - 100.0 ng/mL  TSH   Collection Time: 05/09/24  8:57 AM  Result Value Ref Range   TSH 1.500 0.450 - 4.500 uIU/mL  PSA, total and free   Collection Time: 05/09/24  8:57 AM  Result Value Ref Range   Prostate Specific Ag, Serum 2.4 0.0 - 4.0 ng/mL   PSA, Free 0.66 N/A ng/mL   PSA, Free Pct 27.5 %  Lipid panel   Collection Time: 05/09/24  8:57 AM  Result Value Ref Range   Cholesterol, Total 161 100 - 199 mg/dL   Triglycerides 859 0 - 149 mg/dL   HDL 50 >60 mg/dL   VLDL Cholesterol Cal 25 5 - 40 mg/dL   LDL Chol Calc (NIH) 86 0 - 99 mg/dL   Chol/HDL Ratio 3.2 0.0 - 5.0 ratio  CMP14+EGFR   Collection Time: 05/09/24  8:57 AM  Result Value Ref Range   Glucose 107 (H)  70 - 99 mg/dL   BUN 11 8 - 27 mg/dL   Creatinine, Ser 8.91 0.76 - 1.27 mg/dL   eGFR 74 >40 fO/fpw/8.26   BUN/Creatinine Ratio 10 10 - 24   Sodium 142 134 - 144 mmol/L   Potassium 5.0 3.5 - 5.2 mmol/L   Chloride 104 96 - 106 mmol/L   CO2 22 20 - 29 mmol/L   Calcium  9.6 8.6 - 10.2 mg/dL   Total Protein 6.5 6.0 - 8.5 g/dL   Albumin 4.3 3.9 - 4.9 g/dL   Globulin, Total 2.2 1.5 - 4.5 g/dL   Bilirubin Total 0.9 0.0 - 1.2 mg/dL   Alkaline Phosphatase 85 47 - 123 IU/L   AST 25 0 - 40 IU/L   ALT 26  0 - 44 IU/L  CBC with Differential/Platelet   Collection Time: 05/09/24  8:57 AM  Result Value Ref Range   WBC 6.4 3.4 - 10.8 x10E3/uL   RBC 5.14 4.14 - 5.80 x10E6/uL   Hemoglobin 16.0 13.0 - 17.7 g/dL   Hematocrit 51.6 62.4 - 51.0 %   MCV 94 79 - 97 fL   MCH 31.1 26.6 - 33.0 pg   MCHC 33.1 31.5 - 35.7 g/dL   RDW 87.7 88.3 - 84.5 %   Platelets 264 150 - 450 x10E3/uL   Neutrophils 42 Not Estab. %   Lymphs 44 Not Estab. %   Monocytes 10 Not Estab. %   Eos 2 Not Estab. %   Basos 1 Not Estab. %   Neutrophils Absolute 2.7 1.4 - 7.0 x10E3/uL   Lymphocytes Absolute 3.0 0.7 - 3.1 x10E3/uL   Monocytes Absolute 0.6 0.1 - 0.9 x10E3/uL   EOS (ABSOLUTE) 0.1 0.0 - 0.4 x10E3/uL   Basophils Absolute 0.0 0.0 - 0.2 x10E3/uL   Immature Granulocytes 0 Not Estab. %   Immature Grans (Abs) 0.0 0.0 - 0.1 x10E3/uL    Assessment & Plan:   Problem List Items Addressed This Visit       Cardiovascular and Mediastinum   Aortic atherosclerosis   Relevant Medications   rosuvastatin  (CRESTOR ) 5 MG tablet   tadalafil  (CIALIS ) 10 MG tablet     Digestive   GERD (gastroesophageal reflux disease)   Relevant Medications   pantoprazole  (PROTONIX ) 40 MG tablet     Genitourinary   BPH (benign prostatic hyperplasia)     Other   Hyperlipidemia   Relevant Medications   rosuvastatin  (CRESTOR ) 5 MG tablet   tadalafil  (CIALIS ) 10 MG tablet   Prediabetes   Erectile dysfunction   Relevant Medications    tadalafil  (CIALIS ) 10 MG tablet   Other Visit Diagnoses       Encounter for immunization    -  Primary   Relevant Orders   Flu vaccine HIGH DOSE PF(Fluzone Trivalent) (Completed)     Anxiety       Relevant Medications   busPIRone  (BUSPAR ) 10 MG tablet          Adult Wellness Visit Weight loss likely due to anxiety and decreased appetite. - Continue current medications and supplements. - Encourage adequate protein intake for skin health.  Anxiety Increased anxiety over two months, possibly due to stressors. Trying B6 and magnesium  with unclear benefit. Prefers non-habit forming medications. - Start buspirone  as needed, twice a day. - Consider black cohosh as adjunct treatment.  Constipation Decreased appetite and irregular bowel movements possibly related to constipation. - Start fiber supplementation, such as Metamucil.  Pure hypercholesterolemia Cholesterol levels well-controlled with current medication. No side effects from rosuvastatin . - Continue rosuvastatin  (Crestor ).  Prediabetes Blood sugar levels remain borderline, consistent with previous results.  Gastroesophageal reflux disease (GERD) GERD symptoms well-controlled with current medication. - Continue current GERD medication.  Atherosclerosis of aorta  Benign prostatic hyperplasia with mild lower urinary tract symptoms Mild urinary symptoms, prefers to avoid Flomax  due to potential sexual side effects. - Monitor symptoms and consider Flomax  if symptoms worsen. - Advised to try sitting down to urinate to improve flow.  Erectile dysfunction due to arterial insufficiency Managed with tadalafil  (Cialis ) as needed, effective with no side effects. - Continue tadalafil  (Cialis ) as needed.  Peripheral neuropathy, right foot Chronic numbness in right foot toes, not affecting daily activities. Previously tried gabapentin  with some improvement. - Monitor symptoms and consider  alternative treatments if symptoms  worsen.  Chronic sinus congestion Chronic sinus congestion, especially at night. Using Flonase  and AstiPro with some relief. Advised against Afrin due to rebound congestion. - Continue Flonase  and AstiPro, one puff each in the morning and at night. - Use nasal saline with sterile or boiled water for nasal irrigation.  Age-related thinning of skin Age-related thinning of skin noted. Discussed protein intake and moisturizing as supportive measures. - Ensure adequate protein intake. - Use moisturizers with keratin or vitamin E.          Follow up plan: Return in about 1 year (around 05/10/2025), or if symptoms worsen or fail to improve, for Physical exam.  Counseling provided for all of the vaccine components Orders Placed This Encounter  Procedures   Flu vaccine HIGH DOSE PF(Fluzone Trivalent)    Fonda Levins, MD [facility] 05/10/2024, 10:24 AM

## 2024-08-13 ENCOUNTER — Telehealth: Admitting: Physician Assistant

## 2024-08-13 DIAGNOSIS — B9689 Other specified bacterial agents as the cause of diseases classified elsewhere: Secondary | ICD-10-CM | POA: Diagnosis not present

## 2024-08-13 DIAGNOSIS — J019 Acute sinusitis, unspecified: Secondary | ICD-10-CM

## 2024-08-13 MED ORDER — DOXYCYCLINE HYCLATE 100 MG PO TABS: 100.0000 mg | ORAL_TABLET | Freq: Two times a day (BID) | ORAL | 0 refills | Status: AC

## 2024-08-13 NOTE — Progress Notes (Signed)

## 2025-05-13 ENCOUNTER — Encounter: Payer: Self-pay | Admitting: Family Medicine

## 2025-05-13 ENCOUNTER — Ambulatory Visit: Payer: Self-pay
